# Patient Record
Sex: Female | Born: 1955 | Race: White | Hispanic: No | Marital: Married | Smoking: Former smoker
Health system: Southern US, Community
[De-identification: ages and names within clinical notes are randomized; demographics above are authoritative.]

## PROBLEM LIST (undated history)

## (undated) DIAGNOSIS — F1910 Other psychoactive substance abuse, uncomplicated: Secondary | ICD-10-CM

## (undated) DIAGNOSIS — M797 Fibromyalgia: Secondary | ICD-10-CM

## (undated) DIAGNOSIS — D649 Anemia, unspecified: Secondary | ICD-10-CM

## (undated) DIAGNOSIS — S329XXA Fracture of unspecified parts of lumbosacral spine and pelvis, initial encounter for closed fracture: Secondary | ICD-10-CM

## (undated) DIAGNOSIS — I341 Nonrheumatic mitral (valve) prolapse: Secondary | ICD-10-CM

## (undated) DIAGNOSIS — C801 Malignant (primary) neoplasm, unspecified: Secondary | ICD-10-CM

## (undated) DIAGNOSIS — I10 Essential (primary) hypertension: Secondary | ICD-10-CM

## (undated) DIAGNOSIS — F32A Depression, unspecified: Secondary | ICD-10-CM

## (undated) HISTORY — PX: BREAST SURGERY: SHX581

## (undated) HISTORY — DX: Malignant (primary) neoplasm, unspecified: C80.1

## (undated) HISTORY — DX: Other psychoactive substance abuse, uncomplicated: F19.10

## (undated) HISTORY — DX: Fracture of unspecified parts of lumbosacral spine and pelvis, initial encounter for closed fracture: S32.9XXA

## (undated) HISTORY — DX: Depression, unspecified: F32.A

## (undated) HISTORY — PX: FRACTURE SURGERY: SHX138

## (undated) HISTORY — PX: PELVIC FRACTURE SURGERY: SHX119

---

## 1990-02-09 DIAGNOSIS — J939 Pneumothorax, unspecified: Secondary | ICD-10-CM

## 1990-02-09 DIAGNOSIS — S329XXA Fracture of unspecified parts of lumbosacral spine and pelvis, initial encounter for closed fracture: Secondary | ICD-10-CM

## 1990-02-09 HISTORY — PX: CHEST TUBE INSERTION: SHX231

## 1990-02-09 HISTORY — DX: Pneumothorax, unspecified: J93.9

## 1990-02-09 HISTORY — DX: Fracture of unspecified parts of lumbosacral spine and pelvis, initial encounter for closed fracture: S32.9XXA

## 2000-08-17 ENCOUNTER — Other Ambulatory Visit: Admission: RE | Admit: 2000-08-17 | Discharge: 2000-08-17 | Payer: Self-pay | Admitting: Family Medicine

## 2004-06-26 ENCOUNTER — Other Ambulatory Visit: Admission: RE | Admit: 2004-06-26 | Discharge: 2004-06-26 | Payer: Self-pay | Admitting: Internal Medicine

## 2004-06-26 ENCOUNTER — Ambulatory Visit: Payer: Self-pay | Admitting: Internal Medicine

## 2004-11-12 ENCOUNTER — Ambulatory Visit: Payer: Self-pay | Admitting: Internal Medicine

## 2004-11-27 ENCOUNTER — Ambulatory Visit: Payer: Self-pay | Admitting: Internal Medicine

## 2005-06-01 ENCOUNTER — Ambulatory Visit: Payer: Self-pay | Admitting: Internal Medicine

## 2006-06-04 ENCOUNTER — Encounter: Payer: Self-pay | Admitting: Internal Medicine

## 2006-06-04 DIAGNOSIS — J309 Allergic rhinitis, unspecified: Secondary | ICD-10-CM | POA: Insufficient documentation

## 2006-06-04 DIAGNOSIS — I059 Rheumatic mitral valve disease, unspecified: Secondary | ICD-10-CM | POA: Insufficient documentation

## 2006-06-05 DIAGNOSIS — R609 Edema, unspecified: Secondary | ICD-10-CM | POA: Insufficient documentation

## 2006-06-11 ENCOUNTER — Ambulatory Visit: Payer: Self-pay | Admitting: Internal Medicine

## 2006-06-11 ENCOUNTER — Encounter: Payer: Self-pay | Admitting: Internal Medicine

## 2006-06-11 ENCOUNTER — Other Ambulatory Visit: Admission: RE | Admit: 2006-06-11 | Discharge: 2006-06-11 | Payer: Self-pay | Admitting: Internal Medicine

## 2006-06-23 ENCOUNTER — Encounter: Payer: Self-pay | Admitting: Internal Medicine

## 2006-06-23 ENCOUNTER — Ambulatory Visit: Payer: Self-pay | Admitting: Internal Medicine

## 2006-06-28 ENCOUNTER — Encounter (INDEPENDENT_AMBULATORY_CARE_PROVIDER_SITE_OTHER): Payer: Self-pay | Admitting: *Deleted

## 2006-07-07 ENCOUNTER — Ambulatory Visit: Payer: Self-pay | Admitting: Gastroenterology

## 2008-01-12 ENCOUNTER — Telehealth: Payer: Self-pay | Admitting: Internal Medicine

## 2008-02-14 ENCOUNTER — Ambulatory Visit: Payer: Self-pay | Admitting: Family Medicine

## 2008-11-27 ENCOUNTER — Ambulatory Visit: Payer: Self-pay | Admitting: Family Medicine

## 2008-11-27 DIAGNOSIS — R5383 Other fatigue: Secondary | ICD-10-CM

## 2008-11-27 DIAGNOSIS — M2550 Pain in unspecified joint: Secondary | ICD-10-CM | POA: Insufficient documentation

## 2008-11-27 DIAGNOSIS — R5381 Other malaise: Secondary | ICD-10-CM

## 2008-11-27 LAB — HM COLONOSCOPY

## 2008-11-27 LAB — HM SIGMOIDOSCOPY

## 2008-12-03 ENCOUNTER — Ambulatory Visit: Payer: Self-pay | Admitting: Family Medicine

## 2008-12-03 LAB — CONVERTED CEMR LAB: Anti Nuclear Antibody(ANA): NEGATIVE

## 2008-12-05 ENCOUNTER — Ambulatory Visit: Payer: Self-pay | Admitting: Family Medicine

## 2008-12-05 ENCOUNTER — Encounter (INDEPENDENT_AMBULATORY_CARE_PROVIDER_SITE_OTHER): Payer: Self-pay | Admitting: *Deleted

## 2008-12-05 LAB — CONVERTED CEMR LAB
Basophils Relative: 0.8 % (ref 0.0–3.0)
Bilirubin, Direct: 0.1 mg/dL (ref 0.0–0.3)
Calcium: 9.2 mg/dL (ref 8.4–10.5)
Cholesterol: 227 mg/dL — ABNORMAL HIGH (ref 0–200)
Creatinine, Ser: 0.8 mg/dL (ref 0.4–1.2)
Direct LDL: 133.7 mg/dL
Eosinophils Absolute: 0.2 10*3/uL (ref 0.0–0.7)
Fecal Occult Bld: NEGATIVE
GFR calc non Af Amer: 79.76 mL/min (ref >60)
Hemoglobin: 16 g/dL — ABNORMAL HIGH (ref 12.0–15.0)
Lymphocytes Relative: 23.7 % (ref 12.0–46.0)
MCHC: 34.7 g/dL (ref 30.0–36.0)
MCV: 88.9 fL (ref 78.0–100.0)
Neutro Abs: 3.2 10*3/uL (ref 1.4–7.7)
RBC: 5.18 M/uL — ABNORMAL HIGH (ref 3.87–5.11)
Sed Rate: 10 mm/hr (ref 0–22)
Total Bilirubin: 0.9 mg/dL (ref 0.3–1.2)
Total CHOL/HDL Ratio: 3
Total Protein: 7 g/dL (ref 6.0–8.3)
Triglycerides: 68 mg/dL (ref 0.0–149.0)
VLDL: 13.6 mg/dL (ref 0.0–40.0)

## 2008-12-05 LAB — FECAL OCCULT BLOOD, GUAIAC: Fecal Occult Blood: NEGATIVE

## 2008-12-11 ENCOUNTER — Encounter (INDEPENDENT_AMBULATORY_CARE_PROVIDER_SITE_OTHER): Payer: Self-pay | Admitting: *Deleted

## 2008-12-20 ENCOUNTER — Ambulatory Visit: Payer: Self-pay | Admitting: Family Medicine

## 2008-12-20 ENCOUNTER — Encounter: Payer: Self-pay | Admitting: Family Medicine

## 2008-12-21 ENCOUNTER — Telehealth (INDEPENDENT_AMBULATORY_CARE_PROVIDER_SITE_OTHER): Payer: Self-pay | Admitting: *Deleted

## 2008-12-25 ENCOUNTER — Encounter (INDEPENDENT_AMBULATORY_CARE_PROVIDER_SITE_OTHER): Payer: Self-pay | Admitting: *Deleted

## 2010-08-09 ENCOUNTER — Encounter: Payer: Self-pay | Admitting: Internal Medicine

## 2010-08-12 ENCOUNTER — Ambulatory Visit (INDEPENDENT_AMBULATORY_CARE_PROVIDER_SITE_OTHER): Payer: BC Managed Care – PPO | Admitting: Family Medicine

## 2010-08-12 ENCOUNTER — Encounter: Payer: Self-pay | Admitting: Family Medicine

## 2010-08-12 DIAGNOSIS — Z13220 Encounter for screening for lipoid disorders: Secondary | ICD-10-CM

## 2010-08-12 DIAGNOSIS — R5383 Other fatigue: Secondary | ICD-10-CM

## 2010-08-12 DIAGNOSIS — M2550 Pain in unspecified joint: Secondary | ICD-10-CM

## 2010-08-12 DIAGNOSIS — R5381 Other malaise: Secondary | ICD-10-CM

## 2010-08-12 DIAGNOSIS — M109 Gout, unspecified: Secondary | ICD-10-CM

## 2010-08-12 NOTE — Patient Instructions (Addendum)
Please schedule Complete Physcial Exam with fasting labs prior with myself...whomever you plan to have as primary MD.  Katie Dennis Desk: please change Primary Care MD to [facility]. Stop at front desk to set up rheumatology referral.

## 2010-08-12 NOTE — Assessment & Plan Note (Signed)
Basic rheum eval in 2000 neg except low voit D. Will refer to rheum for further eval... No straighforward fibromyalgia given only 1/2 trigger points are tender.

## 2010-08-12 NOTE — Progress Notes (Signed)
Addended by: Alvina Chou on: 08/12/2010 02:47 PM   Modules accepted: Orders

## 2010-08-12 NOTE — Assessment & Plan Note (Signed)
Evaluate with labs... Also due for labs for CPX.

## 2010-08-12 NOTE — Progress Notes (Signed)
Subjective:    Patient ID: Katie Dennis, female    DOB: 16-Sep-1955, 55 y.o.   MRN: [id number]  HPI  55 year old female not seen in 2 years presents for pain all over.  In past she has been seen for similar: in 2010.. RF, ANA , antiCCP antibodies were normal. Vit D was low.  This was repleted but helped minimally.    Chronic joint pain, feet , knees , hands and wrists, elbows, symmetric.Marland Kitchenocc has swelling in arms legs and hands. Occ difficulty getting up due to severe pain and stiffness. Most severe pain is in knees and ankles. Sometimes has redness and heat in joints.  Pain in joints but also in muscles as well. Has been ongoing x years about 2000.Marland KitchenMarland Kitchenprogressively worsening.   No weak muscles but feels weak due to pain. Burning and swelling in feet at end of the day. Occ tingling in left leg. She thinks in addition to these she may have gout... Redness and heat in great toe and left elbow occ. Has fatigue.  Occ has facial rash, not typically .  Has hand issues with Raynaud's  She does also have occ ulcers in mouth. Has seen doctors in past... no answers. Never had rhuem work up  [employer] to light.  Aunt has RA.  Typically using tylenol or ibuprofen for pain... Helps some but not really.  She does report some depression and anxiety. In past has been on prozac.   She is post menopausal... Last menses was several years ago.     Review of Systems  Constitutional: Positive for fatigue. Negative for fever.  HENT: Negative for congestion, rhinorrhea and neck stiffness.   Eyes: Negative for pain.  Respiratory: Negative for shortness of breath and wheezing.   Cardiovascular: Positive for leg swelling. Negative for chest pain and palpitations.  Gastrointestinal: Negative for abdominal pain, diarrhea, constipation and blood in stool.       Objective:   Physical Exam  Constitutional: She is oriented to person, place, and time. Vital signs are normal. She appears well-developed and  well-nourished. She is cooperative.  Non-toxic appearance. She does not appear ill. No distress.  HENT:  Head: Normocephalic.  Right Ear: Hearing, tympanic membrane, external ear and ear canal normal. Tympanic membrane is not erythematous, not retracted and not bulging.  Left Ear: Hearing, tympanic membrane, external ear and ear canal normal. Tympanic membrane is not erythematous, not retracted and not bulging.  Nose: No mucosal edema or rhinorrhea. Right sinus exhibits no maxillary sinus tenderness and no frontal sinus tenderness. Left sinus exhibits no maxillary sinus tenderness and no frontal sinus tenderness.  Mouth/Throat: Uvula is midline, oropharynx is clear and moist and mucous membranes are normal.  Eyes: Conjunctivae, EOM and lids are normal. Pupils are equal, round, and reactive to light. No foreign bodies found.  Neck: Trachea normal and normal range of motion. Neck supple. Carotid bruit is not present. No mass and no thyromegaly present.  Cardiovascular: Normal rate, regular rhythm, S1 normal, S2 normal, normal heart sounds, intact distal pulses and normal pulses.  Exam reveals no gallop and no friction rub.   No murmur heard. Pulmonary/Chest: Effort normal and breath sounds normal. Not tachypneic. No respiratory distress. She has no decreased breath sounds. She has no wheezes. She has no rhonchi. She has no rales.  Abdominal: Soft. Normal appearance and bowel sounds are normal. There is no tenderness.  Musculoskeletal: Normal range of motion. She exhibits no edema and no tenderness.  No redness or swelling in joints, full ROM throughout. No focal pain in knees or ankles. Neg Mcmurray's, neg ant/post drawer in B knees. About 6 positive trigger points for fibromyalgia, neg control.   Neurological: She is alert and oriented to person, place, and time. She has normal reflexes.  Skin: Skin is warm, dry and intact. No rash noted.  Psychiatric: Her speech is normal and behavior is  normal. Judgment and thought content normal. Her mood appears not anxious. Cognition and memory are normal. She does not exhibit a depressed mood.          Assessment & Plan:

## 2010-10-10 ENCOUNTER — Other Ambulatory Visit (INDEPENDENT_AMBULATORY_CARE_PROVIDER_SITE_OTHER): Payer: BC Managed Care – PPO

## 2010-10-10 DIAGNOSIS — R5383 Other fatigue: Secondary | ICD-10-CM

## 2010-10-10 DIAGNOSIS — M2550 Pain in unspecified joint: Secondary | ICD-10-CM

## 2010-10-10 DIAGNOSIS — M109 Gout, unspecified: Secondary | ICD-10-CM

## 2010-10-10 DIAGNOSIS — Z13220 Encounter for screening for lipoid disorders: Secondary | ICD-10-CM

## 2010-10-10 LAB — CBC WITH DIFFERENTIAL/PLATELET
Basophils Relative: 0.9 % (ref 0.0–3.0)
Eosinophils Relative: 4.8 % (ref 0.0–5.0)
HCT: 45.6 % (ref 36.0–46.0)
Lymphs Abs: 1 10*3/uL (ref 0.7–4.0)
Monocytes Relative: 9.2 % (ref 3.0–12.0)
Neutrophils Relative %: 65.2 % (ref 43.0–77.0)
Platelets: 206 10*3/uL (ref 150.0–400.0)
RBC: 5.18 Mil/uL — ABNORMAL HIGH (ref 3.87–5.11)
WBC: 5 10*3/uL (ref 4.5–10.5)

## 2010-10-10 LAB — COMPREHENSIVE METABOLIC PANEL
ALT: 26 U/L (ref 0–35)
Alkaline Phosphatase: 94 U/L (ref 39–117)
Glucose, Bld: 96 mg/dL (ref 70–99)
Sodium: 142 mEq/L (ref 135–145)
Total Bilirubin: 0.8 mg/dL (ref 0.3–1.2)
Total Protein: 7.1 g/dL (ref 6.0–8.3)

## 2010-10-10 LAB — URIC ACID: Uric Acid, Serum: 5 mg/dL (ref 2.4–7.0)

## 2010-10-10 LAB — LIPID PANEL
Cholesterol: 243 mg/dL — ABNORMAL HIGH (ref 0–200)
HDL: 72.4 mg/dL (ref >39.00)
Total CHOL/HDL Ratio: 3
VLDL: 16.6 mg/dL (ref 0.0–40.0)

## 2010-10-10 LAB — LDL CHOLESTEROL, DIRECT: Direct LDL: 169.1 mg/dL

## 2010-10-14 ENCOUNTER — Encounter: Payer: Self-pay | Admitting: Family Medicine

## 2010-10-14 ENCOUNTER — Ambulatory Visit (INDEPENDENT_AMBULATORY_CARE_PROVIDER_SITE_OTHER): Payer: BC Managed Care – PPO | Admitting: Family Medicine

## 2010-10-14 ENCOUNTER — Other Ambulatory Visit (HOSPITAL_COMMUNITY)
Admission: RE | Admit: 2010-10-14 | Discharge: 2010-10-14 | Disposition: A | Discharge status: Home / Self Care | Payer: BC Managed Care – PPO | Source: Ambulatory Visit | Attending: Family Medicine | Admitting: Family Medicine

## 2010-10-14 VITALS — BP 110/72 | HR 88 | Temp 98.4°F | Ht 61.0 in | Wt 132.0 lb

## 2010-10-14 DIAGNOSIS — M797 Fibromyalgia: Secondary | ICD-10-CM

## 2010-10-14 DIAGNOSIS — Z01419 Encounter for gynecological examination (general) (routine) without abnormal findings: Secondary | ICD-10-CM

## 2010-10-14 DIAGNOSIS — Z1159 Encounter for screening for other viral diseases: Secondary | ICD-10-CM | POA: Insufficient documentation

## 2010-10-14 DIAGNOSIS — N952 Postmenopausal atrophic vaginitis: Secondary | ICD-10-CM

## 2010-10-14 DIAGNOSIS — Z0000 Encounter for general adult medical examination without abnormal findings: Secondary | ICD-10-CM

## 2010-10-14 DIAGNOSIS — Z1231 Encounter for screening mammogram for malignant neoplasm of breast: Secondary | ICD-10-CM

## 2010-10-14 DIAGNOSIS — IMO0001 Reserved for inherently not codable concepts without codable children: Secondary | ICD-10-CM

## 2010-10-14 MED ORDER — ESTROGENS, CONJUGATED 0.625 MG/GM VA CREA: TOPICAL_CREAM | VAGINAL | Status: DC

## 2010-10-14 NOTE — Assessment & Plan Note (Signed)
Diagnosed  By Dr. Kellie Simmering.  Flexeril is helping significantly. Sleeping better so fatigue better.

## 2010-10-14 NOTE — Patient Instructions (Addendum)
Stop by [facility] to get mammo scheduled. Return stool cards.. Stop by lab to pick up.   Start premarin for vaginal issues, let me know if not improving.

## 2010-10-14 NOTE — Progress Notes (Signed)
  Subjective:    Patient ID: Katie Dennis, female    DOB: April 28, 1955, 55 y.o.   MRN: [id number]  HPI  The patient is here for annual wellness exam and preventative care.    Doing well overall.     Review of Systems  Constitutional: Negative for fever and fatigue.  HENT: Negative for ear pain.   Eyes: Negative for pain.  Respiratory: Negative for chest tightness and shortness of breath.   Cardiovascular: Negative for chest pain, palpitations and leg swelling.  Gastrointestinal: Negative for abdominal pain.  Genitourinary: Negative for dysuria.       Pain and dryness with sex.. Menopausal.       Objective:   Physical Exam  Constitutional: Vital signs are normal. She appears well-developed and well-nourished. She is cooperative.  Non-toxic appearance. She does not appear ill. No distress.  HENT:  Head: Normocephalic.  Right Ear: Hearing, tympanic membrane, external ear and ear canal normal.  Left Ear: Hearing, tympanic membrane, external ear and ear canal normal.  Nose: Nose normal.  Eyes: Conjunctivae, EOM and lids are normal. Pupils are equal, round, and reactive to light. No foreign bodies found.  Neck: Trachea normal and normal range of motion. Neck supple. Carotid bruit is not present. No mass and no thyromegaly present.  Cardiovascular: Normal rate, regular rhythm, S1 normal, S2 normal, normal heart sounds and intact distal pulses.  Exam reveals no gallop.   No murmur heard. Pulmonary/Chest: Effort normal and breath sounds normal. No respiratory distress. She has no wheezes. She has no rhonchi. She has no rales.  Abdominal: Soft. Normal appearance and bowel sounds are normal. She exhibits no distension, no fluid wave, no abdominal bruit and no mass. There is no hepatosplenomegaly. There is no tenderness. There is no rebound, no guarding and no CVA tenderness. No hernia.  Genitourinary: Uterus normal. No breast swelling, tenderness, discharge or bleeding. Pelvic exam was  performed with patient prone. There is no rash, tenderness or lesion on the right labia. There is no rash, tenderness or lesion on the left labia. Uterus is not enlarged and not tender. Cervix exhibits no motion tenderness, no discharge and no friability. Right adnexum displays no mass, no tenderness and no fullness. Left adnexum displays no mass, no tenderness and no fullness.       Vaginal dryness and postmenopausal changes  Lymphadenopathy:    She has no cervical adenopathy.    She has no axillary adenopathy.  Neurological: She is alert. She has normal strength. No cranial nerve deficit or sensory deficit.  Skin: Skin is warm, dry and intact. No rash noted.  Psychiatric: Her speech is normal and behavior is normal. Judgment normal. Her mood appears not anxious. Cognition and memory are normal. She does not exhibit a depressed mood.          Assessment & Plan:  The patient's preventative maintenance and recommended screening tests for an annual wellness exam were reviewed in full today. Brought up to date unless services declined.  Counselled on the importance of diet, exercise, and its role in overall health and mortality. The patient's FH and SH was reviewed, including their home life, tobacco status, and drug and alcohol status.   Due for PAP, DVE  Colon cancer screening:  Doing yearly stool cards.  Due for mammogram UpToDate with vaccines

## 2010-10-22 ENCOUNTER — Encounter: Payer: Self-pay | Admitting: *Deleted

## 2010-10-29 DIAGNOSIS — N952 Postmenopausal atrophic vaginitis: Secondary | ICD-10-CM | POA: Insufficient documentation

## 2010-10-29 NOTE — Assessment & Plan Note (Signed)
Start premarin cream, wean to  lowest dose effective. Discussed extensively risk and benefits of this medication. Pt voiced understanding of longterm CAD risk associated with estrogen use.

## 2010-11-06 ENCOUNTER — Other Ambulatory Visit: Payer: BC Managed Care – PPO

## 2010-11-06 ENCOUNTER — Other Ambulatory Visit: Payer: Self-pay | Admitting: Family Medicine

## 2010-11-06 ENCOUNTER — Encounter: Payer: Self-pay | Admitting: *Deleted

## 2010-11-06 DIAGNOSIS — Z1212 Encounter for screening for malignant neoplasm of rectum: Secondary | ICD-10-CM

## 2010-11-06 LAB — FECAL OCCULT BLOOD, IMMUNOCHEMICAL: Fecal Occult Bld: NEGATIVE

## 2010-12-05 ENCOUNTER — Ambulatory Visit (INDEPENDENT_AMBULATORY_CARE_PROVIDER_SITE_OTHER): Payer: BC Managed Care – PPO | Admitting: Family Medicine

## 2010-12-05 ENCOUNTER — Encounter: Payer: Self-pay | Admitting: Family Medicine

## 2010-12-05 DIAGNOSIS — M797 Fibromyalgia: Secondary | ICD-10-CM

## 2010-12-05 DIAGNOSIS — F329 Major depressive disorder, single episode, unspecified: Secondary | ICD-10-CM

## 2010-12-05 DIAGNOSIS — IMO0001 Reserved for inherently not codable concepts without codable children: Secondary | ICD-10-CM

## 2010-12-05 DIAGNOSIS — F339 Major depressive disorder, recurrent, unspecified: Secondary | ICD-10-CM | POA: Insufficient documentation

## 2010-12-05 MED ORDER — TRAMADOL HCL 50 MG PO TABS: 50.0000 mg | ORAL_TABLET | Freq: Two times a day (BID) | ORAL | Status: AC | PRN

## 2010-12-05 MED ORDER — DULOXETINE HCL 30 MG PO CPEP: ORAL_CAPSULE | ORAL | Status: DC

## 2010-12-05 NOTE — Patient Instructions (Signed)
Start cymbalta and increase as tolerated. Use tramadol  Once or twice daily for pain as needed. Continue healthy lifestyle, exercise. Can use flexeril for sleep and muscle spasm as needed but limit. Follow up in 1 month for mood, fibromyalgia. [email or web address]

## 2010-12-05 NOTE — Assessment & Plan Note (Addendum)
Poor control. Discussed treatment of mood. She will use tramadol for pain in limited fashion.  Start cymbalta for pain and mood.  Total visit time 30 minutes, > 50% spent counseling and cordinating patients care.

## 2010-12-05 NOTE — Assessment & Plan Note (Signed)
Poor control, start cymbalta. Work on stress reduction, relaxation. Not interested in counseling. Follow up in 1 months.

## 2010-12-05 NOTE — Progress Notes (Signed)
  Subjective:    Patient ID: Katie Dennis, female    DOB: 04/09/1955, 55 y.o.   MRN: [id number]  HPI  55 year old female presents for follow up on fibromyalgia.  Diagnosed By Dr. Kellie Simmering.  Flexeril had been helping significantly at last appt in 10/2010... She is sleeping well but continuing to have daily pain in lower half of body.  Pain in back, hips, legs feet, knees, pelvis. 6-0/10 on pain scale.  Very stiff after sitting a while.  Pins and needle in feet in AMs. Iburpofen andaleve do not help, some upset stomach.  Mood is poorly controlled... Ongoing for a while.. Past few years. Depression, mild anxiety.  Occ SI in past but not recently. In past she was on prozac, helped some no SE.       Review of Systems  Constitutional: Negative for fever and fatigue.  HENT: Negative for ear pain.   Eyes: Negative for pain.  Respiratory: Negative for chest tightness and shortness of breath.   Cardiovascular: Negative for chest pain, palpitations and leg swelling.  Gastrointestinal: Negative for abdominal pain.  Genitourinary: Negative for dysuria.  Musculoskeletal: Positive for myalgias, back pain and arthralgias. Negative for gait problem.  Skin: Negative for rash.       Objective:   Physical Exam  Constitutional: She appears well-developed and well-nourished.  Musculoskeletal:       Lumbar back: She exhibits normal range of motion and no tenderness.       Multiple tender trigger points in lower extremeties.  Psychiatric: Her speech is normal. Judgment normal. Her mood appears not anxious. She is withdrawn. Cognition and memory are normal. She exhibits a depressed mood. She expresses no suicidal ideation. She expresses no suicidal plans.          Assessment & Plan:

## 2010-12-30 ENCOUNTER — Encounter: Payer: Self-pay | Admitting: Family Medicine

## 2010-12-30 ENCOUNTER — Ambulatory Visit: Payer: Self-pay | Admitting: Family Medicine

## 2010-12-31 ENCOUNTER — Encounter: Payer: Self-pay | Admitting: *Deleted

## 2010-12-31 NOTE — Progress Notes (Signed)
Quick Note:  Epic updated and letter mailed to patient with results ______

## 2011-01-09 ENCOUNTER — Ambulatory Visit: Payer: BC Managed Care – PPO | Admitting: Family Medicine

## 2011-01-30 ENCOUNTER — Ambulatory Visit (INDEPENDENT_AMBULATORY_CARE_PROVIDER_SITE_OTHER): Payer: BC Managed Care – PPO | Admitting: Family Medicine

## 2011-01-30 ENCOUNTER — Encounter: Payer: Self-pay | Admitting: Family Medicine

## 2011-01-30 DIAGNOSIS — IMO0001 Reserved for inherently not codable concepts without codable children: Secondary | ICD-10-CM

## 2011-01-30 DIAGNOSIS — M797 Fibromyalgia: Secondary | ICD-10-CM

## 2011-01-30 DIAGNOSIS — F329 Major depressive disorder, single episode, unspecified: Secondary | ICD-10-CM

## 2011-01-30 MED ORDER — DULOXETINE HCL 60 MG PO CPEP: ORAL_CAPSULE | ORAL | Status: DC

## 2011-01-30 MED ORDER — CYCLOBENZAPRINE HCL 10 MG PO TABS: ORAL_TABLET | ORAL | Status: DC

## 2011-01-30 MED ORDER — TRAMADOL HCL 50 MG PO TABS: ORAL_TABLET | ORAL | Status: DC

## 2011-01-30 NOTE — Patient Instructions (Signed)
Continue cymbalta. Follow up as needed or for CPX in 10/2011. Do not stop med without MD guidance.  Continue working on regular exercise and healthy eating.

## 2011-01-30 NOTE — Progress Notes (Signed)
  Subjective:    Patient ID: Katie Dennis, female    DOB: [birth year], 55 y.o.   MRN: [id number]  HPI 55 year old female presents for follow up on fibromyalgia.  Diagnosed By Dr. Kellie Simmering.  Flexeril had been helping significantly at last appt in 10/2010...   At last appt : Pain in back, hips, legs feet, knees, pelvis. 6-0/10 on pain scale. She is sleeping well but continuing to have daily pain in lower half of body.  Very stiff after sitting a while.  Pins and needle in feet in AMs.    Depression, mild anxiety.  At last OV poorly controlled.   She has now been on cymbalta 60 mg  since 10/26... She reports that her pain has diminished greatly. He mood is much better.  No SI, no insomnia. Using tramadol for flares...every few days, -2 tabs at a time. Using flexeril using every 1-2 tabs nightly.    Review of Systems  Constitutional: Negative for fever and fatigue.  HENT: Negative for ear pain.   Eyes: Negative for pain.  Respiratory: Negative for chest tightness and shortness of breath.   Cardiovascular: Negative for chest pain, palpitations and leg swelling.  Gastrointestinal: Negative for abdominal pain.  Genitourinary: Negative for dysuria.       Objective:   Physical Exam  Constitutional: She appears well-developed and well-nourished.  HENT:  Head: Normocephalic and atraumatic.  Eyes: Conjunctivae and EOM are normal. Pupils are equal, round, and reactive to light.  Neck: Normal range of motion. Neck supple.  Cardiovascular: Normal rate, regular rhythm, normal heart sounds and intact distal pulses.  Exam reveals no gallop and no friction rub.   No murmur heard. Musculoskeletal:       No trigger points noted  Skin: Skin is warm and dry.  Psychiatric: She has a normal mood and affect. Her behavior is normal. Judgment and thought content normal.          Assessment & Plan:

## 2011-01-30 NOTE — Assessment & Plan Note (Signed)
Improved on cymbalta 60 mg daily. Use tramadol and flexeril on limited basis.

## 2011-01-30 NOTE — Assessment & Plan Note (Signed)
Significant improvement on cymbalta.

## 2011-04-12 ENCOUNTER — Other Ambulatory Visit: Payer: Self-pay | Admitting: Family Medicine

## 2011-04-24 ENCOUNTER — Other Ambulatory Visit: Payer: Self-pay | Admitting: Family Medicine

## 2011-04-24 NOTE — Telephone Encounter (Signed)
LAst OV 12/12

## 2011-06-10 ENCOUNTER — Other Ambulatory Visit: Payer: Self-pay | Admitting: Family Medicine

## 2011-06-15 ENCOUNTER — Other Ambulatory Visit: Payer: Self-pay | Admitting: Family Medicine

## 2011-08-07 ENCOUNTER — Other Ambulatory Visit: Payer: Self-pay | Admitting: Family Medicine

## 2011-08-09 ENCOUNTER — Other Ambulatory Visit: Payer: Self-pay | Admitting: Family Medicine

## 2011-08-12 ENCOUNTER — Other Ambulatory Visit: Payer: Self-pay | Admitting: Family Medicine

## 2011-10-07 ENCOUNTER — Other Ambulatory Visit: Payer: Self-pay | Admitting: Family Medicine

## 2011-10-09 NOTE — Telephone Encounter (Signed)
Will refill once, but needs to make appt for cpx or yearly follow up.

## 2011-10-09 NOTE — Telephone Encounter (Signed)
Ok to refill 

## 2011-10-12 ENCOUNTER — Other Ambulatory Visit: Payer: Self-pay | Admitting: Family Medicine

## 2011-12-10 ENCOUNTER — Other Ambulatory Visit: Payer: Self-pay | Admitting: Family Medicine

## 2011-12-10 NOTE — Telephone Encounter (Signed)
Ok to refill 

## 2011-12-11 NOTE — Telephone Encounter (Signed)
Due for follow up in December.. Have pt make appt and refill until then.

## 2011-12-12 ENCOUNTER — Other Ambulatory Visit: Payer: Self-pay | Admitting: Family Medicine

## 2011-12-13 ENCOUNTER — Other Ambulatory Visit: Payer: Self-pay | Admitting: Family Medicine

## 2011-12-14 NOTE — Telephone Encounter (Signed)
Refill once, but pt needs appt for follow up.

## 2011-12-14 NOTE — Telephone Encounter (Signed)
Refill called in to pharmacy

## 2011-12-15 NOTE — Telephone Encounter (Signed)
Received refill request electronically. Last office visit 01/30/11. Is it okay to refill medication?

## 2011-12-17 ENCOUNTER — Encounter: Payer: Self-pay | Admitting: Family Medicine

## 2011-12-17 ENCOUNTER — Ambulatory Visit (INDEPENDENT_AMBULATORY_CARE_PROVIDER_SITE_OTHER): Payer: BC Managed Care – PPO | Admitting: Family Medicine

## 2011-12-17 VITALS — BP 150/92 | HR 103 | Temp 98.4°F | Ht 61.5 in | Wt 143.8 lb

## 2011-12-17 DIAGNOSIS — I10 Essential (primary) hypertension: Secondary | ICD-10-CM | POA: Insufficient documentation

## 2011-12-17 LAB — POCT URINALYSIS DIPSTICK
Blood, UA: NEGATIVE
Ketones, UA: NEGATIVE
Spec Grav, UA: 1.025

## 2011-12-17 MED ORDER — LOSARTAN POTASSIUM-HCTZ 50-12.5 MG PO TABS: 1.0000 | ORAL_TABLET | Freq: Every day | ORAL | Status: DC

## 2011-12-17 NOTE — Progress Notes (Signed)
  Subjective:    Patient ID: Katie Dennis, female    DOB: 18-May-1955, 56 y.o.   MRN: [id number]  HPI  56 year old female presents with recently elevated BPs at home.. 164/100, 190/100 last night.  She has noted increase since 09/2011 following knee injury.She has noted a lot of peripheral swelling, in ankles but all over. Mild chest tightness intermittently, worse at night, at rest, no change with exertion. Has noted increase in DOE.     No exercise, healthy diet. Has had increase in stress. Nonsmoker.  Personal history of pregnancy induced HTN, normalized afterward.  Mother with HTN.    Review of Systems  Constitutional: Positive for fatigue. Negative for fever.  HENT: Negative for rhinorrhea.   Eyes: Negative for pain and visual disturbance.  Respiratory: Negative for cough.   Gastrointestinal: Negative for abdominal pain.  Skin: Negative for rash.       Objective:   Physical Exam  Constitutional: Vital signs are normal. She appears well-developed and well-nourished. She is cooperative.  Non-toxic appearance. She does not appear ill. No distress.  HENT:  Head: Normocephalic.  Right Ear: Hearing, tympanic membrane, external ear and ear canal normal. Tympanic membrane is not erythematous, not retracted and not bulging.  Left Ear: Hearing, tympanic membrane, external ear and ear canal normal. Tympanic membrane is not erythematous, not retracted and not bulging.  Nose: No mucosal edema or rhinorrhea. Right sinus exhibits no maxillary sinus tenderness and no frontal sinus tenderness. Left sinus exhibits no maxillary sinus tenderness and no frontal sinus tenderness.  Mouth/Throat: Uvula is midline, oropharynx is clear and moist and mucous membranes are normal.  Eyes: Conjunctivae normal, EOM and lids are normal. Pupils are equal, round, and reactive to light. No foreign bodies found.  Neck: Trachea normal and normal range of motion. Neck supple. Carotid bruit is not present. No mass  and no thyromegaly present.  Cardiovascular: Normal rate, regular rhythm, S1 normal, S2 normal, normal heart sounds, intact distal pulses and normal pulses.  Exam reveals no gallop and no friction rub.   No murmur heard. Pulmonary/Chest: Effort normal and breath sounds normal. Not tachypneic. No respiratory distress. She has no decreased breath sounds. She has no wheezes. She has no rhonchi. She has no rales.  Abdominal: Soft. Normal appearance and bowel sounds are normal. There is no tenderness.  Neurological: She is alert.  Skin: Skin is warm, dry and intact. No rash noted.  Psychiatric: Her speech is normal and behavior is normal. Judgment and thought content normal. Her mood appears not anxious. Cognition and memory are normal. She does not exhibit a depressed mood.          Assessment & Plan:

## 2011-12-17 NOTE — Assessment & Plan Note (Addendum)
Eval for end organ damage, secondary causes and risk factors. (EKG NSR, no LVH, UA trace protein)  Start on losartan HCTZ. Encouraged exercise, weight loss, healthy eating habits. Follow Bp at home and recheck in 2 weeks (Cr repeat at that time as well)

## 2011-12-17 NOTE — Patient Instructions (Addendum)
Work on regular exercise as tolerated, decrease stress, work on 10% current weight reduction. We will call you with lab results. Start losartan/HCTZ daily. Follow BP at home... Goal <140/90. Follow up in 2 weeks. Fasting labs in next few days Schedule CPX in next few months as well.. No labs prior. Katie Dennis

## 2011-12-21 ENCOUNTER — Other Ambulatory Visit (INDEPENDENT_AMBULATORY_CARE_PROVIDER_SITE_OTHER): Payer: BC Managed Care – PPO

## 2011-12-21 DIAGNOSIS — I10 Essential (primary) hypertension: Secondary | ICD-10-CM

## 2011-12-21 LAB — CBC WITH DIFFERENTIAL/PLATELET
Eosinophils Relative: 4.8 % (ref 0.0–5.0)
HCT: 47.6 % — ABNORMAL HIGH (ref 36.0–46.0)
Hemoglobin: 15.5 g/dL — ABNORMAL HIGH (ref 12.0–15.0)
Lymphs Abs: 1.4 10*3/uL (ref 0.7–4.0)
MCV: 84.6 fl (ref 78.0–100.0)
Monocytes Absolute: 0.7 10*3/uL (ref 0.1–1.0)
Monocytes Relative: 9.2 % (ref 3.0–12.0)
Neutro Abs: 4.9 10*3/uL (ref 1.4–7.7)
RDW: 12.8 % (ref 11.5–14.6)

## 2011-12-21 LAB — COMPREHENSIVE METABOLIC PANEL
ALT: 28 U/L (ref 0–35)
AST: 23 U/L (ref 0–37)
Albumin: 3.9 g/dL (ref 3.5–5.2)
Alkaline Phosphatase: 114 U/L (ref 39–117)
BUN: 16 mg/dL (ref 6–23)
Calcium: 9.3 mg/dL (ref 8.4–10.5)
Chloride: 101 mEq/L (ref 96–112)
Potassium: 4.6 mEq/L (ref 3.5–5.1)
Total Protein: 7.1 g/dL (ref 6.0–8.3)

## 2011-12-21 LAB — LIPID PANEL: Total CHOL/HDL Ratio: 4

## 2011-12-21 LAB — TSH: TSH: 1.62 u[IU]/mL (ref 0.35–5.50)

## 2011-12-21 LAB — LDL CHOLESTEROL, DIRECT: Direct LDL: 132.3 mg/dL

## 2011-12-25 ENCOUNTER — Encounter: Payer: Self-pay | Admitting: *Deleted

## 2012-02-09 ENCOUNTER — Other Ambulatory Visit: Payer: Self-pay | Admitting: Family Medicine

## 2012-03-25 ENCOUNTER — Encounter: Payer: BC Managed Care – PPO | Admitting: Family Medicine

## 2012-04-19 ENCOUNTER — Other Ambulatory Visit: Payer: Self-pay | Admitting: Family Medicine

## 2012-04-20 ENCOUNTER — Other Ambulatory Visit: Payer: Self-pay | Admitting: Family Medicine

## 2012-05-13 ENCOUNTER — Encounter: Payer: Self-pay | Admitting: Family Medicine

## 2012-05-13 ENCOUNTER — Ambulatory Visit (INDEPENDENT_AMBULATORY_CARE_PROVIDER_SITE_OTHER): Payer: BC Managed Care – PPO | Admitting: Family Medicine

## 2012-05-13 VITALS — BP 110/72 | HR 86 | Temp 98.2°F | Ht 61.5 in | Wt 140.0 lb

## 2012-05-13 DIAGNOSIS — Z23 Encounter for immunization: Secondary | ICD-10-CM

## 2012-05-13 DIAGNOSIS — IMO0001 Reserved for inherently not codable concepts without codable children: Secondary | ICD-10-CM

## 2012-05-13 DIAGNOSIS — Z1212 Encounter for screening for malignant neoplasm of rectum: Secondary | ICD-10-CM

## 2012-05-13 DIAGNOSIS — M797 Fibromyalgia: Secondary | ICD-10-CM

## 2012-05-13 DIAGNOSIS — F329 Major depressive disorder, single episode, unspecified: Secondary | ICD-10-CM

## 2012-05-13 DIAGNOSIS — I10 Essential (primary) hypertension: Secondary | ICD-10-CM

## 2012-05-13 DIAGNOSIS — Z0000 Encounter for general adult medical examination without abnormal findings: Secondary | ICD-10-CM

## 2012-05-13 NOTE — Addendum Note (Signed)
Addended by: Consuello Masse on: 05/13/2012 11:26 AM   Modules accepted: Orders

## 2012-05-13 NOTE — Progress Notes (Signed)
HPI  The patient is here for annual wellness exam and preventative care.   Doing well overall.   Hypertension:   At goal on current med... Hyzaar. Using medication without problems or lightheadedness: None Chest pain with exertion:None Edema: at end of the day Short of breath:None Average home BPs:  occ diastolic elevated, better with exercise Other issues: Wt Readings from Last 3 Encounters:  05/13/12 140 lb (63.504 kg)  12/17/11 143 lb 12.8 oz (65.227 kg)  01/30/11 134 lb 12.8 oz (61.145 kg)    Cholesterol at almost at goal on no med. Lab Results  Component Value Date   CHOL 202* 12/21/2011   HDL 51.60 12/21/2011   LDLDIRECT 132.3 12/21/2011   TRIG 144.0 12/21/2011   CHOLHDL 4 12/21/2011   Prediabetes: FBS was elevated 4 months ago with labs.  Recently with exercise ... FBS at home has been at 69-117.Marland Kitchen Better with recent exercise.  Fibromyalgia:  Stable on muscle relaxant..  cyclobenzaprine20 mg at bedtime, cymbalta, using tramadol every day as needed for pain. Uses more with flares associated with weather changes.  Going to [facility]  3 times a week. Diet: poor, snacks a lot  Wt Readings from Last 3 Encounters:  05/13/12 140 lb (63.504 kg)  12/17/11 143 lb 12.8 oz (65.227 kg)  01/30/11 134 lb 12.8 oz (61.145 kg)   Uses mg with melatonin.  Review of Systems  Constitutional: Negative for fever and fatigue.  HENT: Negative for ear pain.  Eyes: Negative for pain.  Respiratory: Negative for chest tightness and shortness of breath.  Cardiovascular: Negative for chest pain, palpitations and leg swelling.  Gastrointestinal: Negative for abdominal pain.  Come constipation controlled with stool softner. Genitourinary: Negative for dysuria.  Pain and dryness with sex.. Menopausal.  Objective:   Physical Exam  Constitutional: Vital signs are normal. She appears well-developed and well-nourished. She is cooperative. Non-toxic appearance. She does not appear ill. No distress.   HENT:  Head: Normocephalic.  Right Ear: Hearing, tympanic membrane, external ear and ear canal normal.  Left Ear: Hearing, tympanic membrane, external ear and ear canal normal.  Nose: Nose normal.  Eyes: Conjunctivae, EOM and lids are normal. Pupils are equal, round, and reactive to light. No foreign bodies found.  Neck: Trachea normal and normal range of motion. Neck supple. Carotid bruit is not present. No mass and no thyromegaly present.  Cardiovascular: Normal rate, regular rhythm, S1 normal, S2 normal, normal heart sounds and intact distal pulses. Exam reveals no gallop.  No murmur heard.  Pulmonary/Chest: Effort normal and breath sounds normal. No respiratory distress. She has no wheezes. She has no rhonchi. She has no rales.  Abdominal: Soft. Normal appearance and bowel sounds are normal. She exhibits no distension, no fluid wave, no abdominal bruit and no mass. There is no hepatosplenomegaly. There is no tenderness. There is no rebound, no guarding and no CVA tenderness. No hernia.  Genitourinary: Uterus normal. No breast swelling, tenderness, discharge or bleeding.  There is no rash, tenderness or lesion on the right labia. There is no rash, tenderness or lesion on the left labia. Uterus is not enlarged and not tender. Right adnexum displays no mass, no tenderness and no fullness. Left adnexum displays no mass, no tenderness and no fullness.  Vaginal dryness and postmenopausal changes  Lymphadenopathy:  She has no cervical adenopathy.  She has no axillary adenopathy.  Neurological: She is alert. She has normal strength. No cranial nerve deficit or sensory deficit.  Skin: Skin is warm,  dry and intact. No rash noted.  Psychiatric: Her speech is normal and behavior is normal. Judgment normal. Her mood appears not anxious. Cognition and memory are normal. She does not exhibit a depressed mood.  Assessment & Plan:   The patient's preventative maintenance and recommended screening tests for  an annual wellness exam were reviewed in full today.  Brought up to date unless services declined.  Counselled on the importance of diet, exercise, and its role in overall health and mortality.  The patient's FH and SH was reviewed, including their home life, tobacco status, and drug and alcohol status.   Last pap 2012, on q3 year schedule, yearly DVE. Colon cancer screening: Doing yearly stool cards.  Due for mammogram  Vaccines: Due for Tdap.

## 2012-05-13 NOTE — Assessment & Plan Note (Signed)
Well-controlled on current regimen. ?

## 2012-05-13 NOTE — Assessment & Plan Note (Signed)
Well controlled. Continue current medication.  

## 2012-05-13 NOTE — Assessment & Plan Note (Signed)
Stable control on cymbalta. 

## 2012-05-13 NOTE — Patient Instructions (Addendum)
Keep a eye on blood sugar, goal <100. Continue working on exercise weight loss, and improve eating. Stop at lab on way out. Schedule mammogram on your own.

## 2012-05-31 ENCOUNTER — Other Ambulatory Visit (INDEPENDENT_AMBULATORY_CARE_PROVIDER_SITE_OTHER): Payer: BC Managed Care – PPO

## 2012-05-31 ENCOUNTER — Other Ambulatory Visit: Payer: Self-pay | Admitting: Family Medicine

## 2012-05-31 DIAGNOSIS — Z1212 Encounter for screening for malignant neoplasm of rectum: Secondary | ICD-10-CM

## 2012-05-31 LAB — FECAL OCCULT BLOOD, IMMUNOCHEMICAL: Fecal Occult Bld: NEGATIVE

## 2012-06-12 ENCOUNTER — Other Ambulatory Visit: Payer: Self-pay | Admitting: Family Medicine

## 2012-08-02 ENCOUNTER — Other Ambulatory Visit: Payer: Self-pay | Admitting: Family Medicine

## 2012-08-18 ENCOUNTER — Other Ambulatory Visit: Payer: Self-pay | Admitting: Family Medicine

## 2012-09-20 ENCOUNTER — Other Ambulatory Visit: Payer: Self-pay | Admitting: Family Medicine

## 2012-09-21 NOTE — Telephone Encounter (Signed)
Refill called to [facility]. 

## 2012-10-04 ENCOUNTER — Other Ambulatory Visit: Payer: Self-pay | Admitting: Family Medicine

## 2012-10-18 ENCOUNTER — Other Ambulatory Visit: Payer: Self-pay | Admitting: Family Medicine

## 2012-10-18 NOTE — Telephone Encounter (Signed)
Received refill request electronically. Last office visit 05/13/12. See drug warning with Tramadol. Is it okay to refill medication?

## 2012-11-15 ENCOUNTER — Encounter: Payer: Self-pay | Admitting: Family Medicine

## 2012-11-15 ENCOUNTER — Ambulatory Visit (INDEPENDENT_AMBULATORY_CARE_PROVIDER_SITE_OTHER): Payer: BC Managed Care – PPO | Admitting: Family Medicine

## 2012-11-15 VITALS — BP 128/96 | HR 75 | Temp 98.2°F | Ht 61.5 in | Wt 144.5 lb

## 2012-11-15 DIAGNOSIS — Z79899 Other long term (current) drug therapy: Secondary | ICD-10-CM

## 2012-11-15 DIAGNOSIS — I10 Essential (primary) hypertension: Secondary | ICD-10-CM

## 2012-11-15 DIAGNOSIS — R609 Edema, unspecified: Secondary | ICD-10-CM | POA: Insufficient documentation

## 2012-11-15 LAB — COMPREHENSIVE METABOLIC PANEL
ALT: 26 U/L (ref 0–35)
AST: 21 U/L (ref 0–37)
Albumin: 3.8 g/dL (ref 3.5–5.2)
Alkaline Phosphatase: 84 U/L (ref 39–117)
Creatinine, Ser: 0.9 mg/dL (ref 0.4–1.2)
Glucose, Bld: 110 mg/dL — ABNORMAL HIGH (ref 70–99)
Potassium: 4.4 mEq/L (ref 3.5–5.1)
Sodium: 140 mEq/L (ref 135–145)
Total Bilirubin: 0.6 mg/dL (ref 0.3–1.2)
Total Protein: 6.9 g/dL (ref 6.0–8.3)

## 2012-11-15 LAB — LDL CHOLESTEROL, DIRECT: Direct LDL: 153.4 mg/dL

## 2012-11-15 LAB — LIPID PANEL
Cholesterol: 224 mg/dL — ABNORMAL HIGH (ref 0–200)
Total CHOL/HDL Ratio: 4
Triglycerides: 129 mg/dL (ref 0.0–149.0)
VLDL: 25.8 mg/dL (ref 0.0–40.0)

## 2012-11-15 MED ORDER — LOSARTAN POTASSIUM-HCTZ 100-25 MG PO TABS: 1.0000 | ORAL_TABLET | Freq: Every day | ORAL | Status: DC

## 2012-11-15 MED ORDER — TRAMADOL HCL 50 MG PO TABS: ORAL_TABLET | ORAL | Status: DC

## 2012-11-15 NOTE — Progress Notes (Signed)
  Subjective:    Patient ID: Katie Dennis, female    DOB: 10-Oct-1955, 57 y.o.   MRN: [id number]  HPI 57 year old female presents with recent increase in BPS.  Hypertension:  Inadequate control on losartan HCTZ  50/12.5 mg  She has been noting fluid and swelling off and on since July. BPs elevated since then.  Always tired. Using medication without problems or lightheadedness: occ Chest pain with exertion:None  Edema:yes Short of breath: mild Average home BPs: 134-150/84-100 Other issues:     Review of Systems  Constitutional: Negative for fever and fatigue.  HENT: Negative for ear pain.   Eyes: Negative for pain.  Respiratory: Negative for chest tightness and shortness of breath.   Cardiovascular: Positive for leg swelling. Negative for chest pain and palpitations.  Gastrointestinal: Negative for abdominal pain.  Genitourinary: Negative for dysuria.       Objective:   Physical Exam  Constitutional: Vital signs are normal. She appears well-developed and well-nourished. She is cooperative.  Non-toxic appearance. She does not appear ill. No distress.  HENT:  Head: Normocephalic.  Right Ear: Hearing, tympanic membrane, external ear and ear canal normal. Tympanic membrane is not erythematous, not retracted and not bulging.  Left Ear: Hearing, tympanic membrane, external ear and ear canal normal. Tympanic membrane is not erythematous, not retracted and not bulging.  Nose: No mucosal edema or rhinorrhea. Right sinus exhibits no maxillary sinus tenderness and no frontal sinus tenderness. Left sinus exhibits no maxillary sinus tenderness and no frontal sinus tenderness.  Mouth/Throat: Uvula is midline, oropharynx is clear and moist and mucous membranes are normal.  Eyes: Conjunctivae, EOM and lids are normal. Pupils are equal, round, and reactive to light. Lids are everted and swept, no foreign bodies found.  Neck: Trachea normal and normal range of motion. Neck supple. Carotid bruit is not  present. No mass and no thyromegaly present.  Cardiovascular: Normal rate, regular rhythm, S1 normal, S2 normal, normal heart sounds, intact distal pulses and normal pulses.  Exam reveals no gallop and no friction rub.   No murmur heard. No peripheral edema today  Pulmonary/Chest: Effort normal and breath sounds normal. Not tachypneic. No respiratory distress. She has no decreased breath sounds. She has no wheezes. She has no rhonchi. She has no rales.  Abdominal: Soft. Normal appearance and bowel sounds are normal. There is no tenderness.  Neurological: She is alert.  Skin: Skin is warm, dry and intact. No rash noted.  Psychiatric: Her speech is normal and behavior is normal. Judgment and thought content normal. Her mood appears not anxious. Cognition and memory are normal. She does not exhibit a depressed mood.          Assessment & Plan:

## 2012-11-15 NOTE — Assessment & Plan Note (Addendum)
Inadequate control... Increase losartan HCTZ. Encouraged exercise, weight loss, healthy eating habits.  Due for chol re-eval.

## 2012-11-15 NOTE — Patient Instructions (Addendum)
Increase losartan HCTZ to 100/25 mg daily. Follow BP at home. Call if is consistently remaining above 140/90. Follow up BP in 2 weeks.

## 2012-11-15 NOTE — Assessment & Plan Note (Signed)
eval with labs for causes.

## 2012-11-28 ENCOUNTER — Encounter: Payer: Self-pay | Admitting: Radiology

## 2012-11-29 ENCOUNTER — Ambulatory Visit (INDEPENDENT_AMBULATORY_CARE_PROVIDER_SITE_OTHER): Payer: BC Managed Care – PPO | Admitting: Family Medicine

## 2012-11-29 ENCOUNTER — Encounter: Payer: Self-pay | Admitting: Family Medicine

## 2012-11-29 VITALS — BP 120/80 | HR 80 | Temp 98.5°F | Ht 61.5 in | Wt 145.0 lb

## 2012-11-29 DIAGNOSIS — E7800 Pure hypercholesterolemia, unspecified: Secondary | ICD-10-CM

## 2012-11-29 DIAGNOSIS — R7309 Other abnormal glucose: Secondary | ICD-10-CM

## 2012-11-29 DIAGNOSIS — I10 Essential (primary) hypertension: Secondary | ICD-10-CM

## 2012-11-29 DIAGNOSIS — R7303 Prediabetes: Secondary | ICD-10-CM | POA: Insufficient documentation

## 2012-11-29 LAB — BASIC METABOLIC PANEL
BUN: 16 mg/dL (ref 6–23)
Calcium: 9.4 mg/dL (ref 8.4–10.5)
GFR: 73.28 mL/min (ref >60.00)
Potassium: 3.5 mEq/L (ref 3.5–5.1)
Sodium: 139 mEq/L (ref 135–145)

## 2012-11-29 MED ORDER — AMLODIPINE BESYLATE 5 MG PO TABS: 5.0000 mg | ORAL_TABLET | Freq: Every day | ORAL | Status: DC

## 2012-11-29 NOTE — Assessment & Plan Note (Signed)
Encouraged exercise, weight loss, healthy eating habits. ? ?

## 2012-11-29 NOTE — Assessment & Plan Note (Signed)
Work on [employer]. Start red yeast rice.

## 2012-11-29 NOTE — Patient Instructions (Addendum)
Start amlodipine 5 mg daily. Continue losartan HCTZ. Work on low fat, low cholesterol, low carbohydrate diet. Start red yeast rice 600 mg twice tabs twice daily. Stop at the lab on way out. Follow BP at home.. Call if it remains above 140/90 consistently. Follow up BP in 1 month.

## 2012-11-29 NOTE — Assessment & Plan Note (Signed)
Improved control here, but reamins poor at home. She has evaluated her cuff.  Check Creatinine on higher dose losartan today.  Start amlodipine 5 mg daily. Follow at home and follow up in  1 month for BP chevck.

## 2012-11-29 NOTE — Progress Notes (Signed)
  Subjective:    Patient ID: Katie Dennis, female    DOB: Nov 02, 1955, 57 y.o.   MRN: [id number]  HPI 57 year old female returns for 2 week follow up on HTN. At last OV increased losartan HCTZ. BP Readings from Last 3 Encounters:  11/29/12 120/80  11/15/12 128/96  05/13/12 110/72     Using medication without problems or lightheadedness: None Chest pain with exertion: No Edema:Improved. Short of breath:No Average home BPs: 136-150/78-96 Other issues: She had been on atenolol in past for mitral valve prolapse, tolerated well.  Labs reviewed in detail with pt   Cholesterol not at goal <130.  Eating out a lot. Lab Results  Component Value Date   CHOL 224* 11/15/2012   HDL 56.60 11/15/2012   LDLDIRECT 153.4 11/15/2012   TRIG 129.0 11/15/2012   CHOLHDL 4 11/15/2012   New dx prediabtes.  Review of Systems  Constitutional: Negative for fever and fatigue.  HENT: Negative for ear pain.   Eyes: Negative for pain.  Respiratory: Negative for chest tightness and shortness of breath.   Cardiovascular: Negative for chest pain, palpitations and leg swelling.  Gastrointestinal: Negative for abdominal pain.  Genitourinary: Negative for dysuria.       Objective:   Physical Exam  Constitutional: Vital signs are normal. She appears well-developed and well-nourished. She is cooperative.  Non-toxic appearance. She does not appear ill. No distress.  HENT:  Head: Normocephalic.  Right Ear: Hearing, tympanic membrane, external ear and ear canal normal. Tympanic membrane is not erythematous, not retracted and not bulging.  Left Ear: Hearing, tympanic membrane, external ear and ear canal normal. Tympanic membrane is not erythematous, not retracted and not bulging.  Nose: No mucosal edema or rhinorrhea. Right sinus exhibits no maxillary sinus tenderness and no frontal sinus tenderness. Left sinus exhibits no maxillary sinus tenderness and no frontal sinus tenderness.  Mouth/Throat: Uvula is midline,  oropharynx is clear and moist and mucous membranes are normal.  Eyes: Conjunctivae, EOM and lids are normal. Pupils are equal, round, and reactive to light. Lids are everted and swept, no foreign bodies found.  Neck: Trachea normal and normal range of motion. Neck supple. Carotid bruit is not present. No mass and no thyromegaly present.  Cardiovascular: Normal rate, regular rhythm, S1 normal, S2 normal, normal heart sounds, intact distal pulses and normal pulses.  Exam reveals no gallop and no friction rub.   No murmur heard. Pulmonary/Chest: Effort normal and breath sounds normal. Not tachypneic. No respiratory distress. She has no decreased breath sounds. She has no wheezes. She has no rhonchi. She has no rales.  Abdominal: Soft. Normal appearance and bowel sounds are normal. There is no tenderness.  Neurological: She is alert.  Skin: Skin is warm, dry and intact. No rash noted.  Psychiatric: Her speech is normal and behavior is normal. Judgment and thought content normal. Her mood appears not anxious. Cognition and memory are normal. She does not exhibit a depressed mood.          Assessment & Plan:

## 2012-11-30 ENCOUNTER — Encounter: Payer: Self-pay | Admitting: *Deleted

## 2012-12-09 ENCOUNTER — Other Ambulatory Visit: Payer: Self-pay | Admitting: Family Medicine

## 2012-12-09 NOTE — Telephone Encounter (Signed)
Last office visit 11/29/2012.  Ok to refill? 

## 2012-12-12 ENCOUNTER — Other Ambulatory Visit: Payer: Self-pay | Admitting: Family Medicine

## 2012-12-13 ENCOUNTER — Encounter: Payer: Self-pay | Admitting: Family Medicine

## 2012-12-30 ENCOUNTER — Ambulatory Visit (INDEPENDENT_AMBULATORY_CARE_PROVIDER_SITE_OTHER): Payer: BC Managed Care – PPO | Admitting: Family Medicine

## 2012-12-30 ENCOUNTER — Encounter: Payer: Self-pay | Admitting: Family Medicine

## 2012-12-30 VITALS — BP 120/80 | HR 87 | Temp 98.3°F | Ht 61.5 in | Wt 147.0 lb

## 2012-12-30 DIAGNOSIS — I10 Essential (primary) hypertension: Secondary | ICD-10-CM

## 2012-12-30 NOTE — Progress Notes (Signed)
  Subjective:    Patient ID: Katie Dennis, female    DOB: 1955/05/10, 57 y.o.   MRN: [id number]  HPI 57 year old female presents for BP follow up. At last OV office numbers were good but home numbers on reliable cuff remained elevated consistently.  Hypertension:  Well controlled at today's OV now on max losartan/HCTZ and now amlodipine 5 mg daily Using medication without problems or lightheadedness:  Chest pain with exertion:None Edema:None Short of breath:None Average home BPs: Has not been checking regualrly at home... Has been under a lot of stress. Other issues:    Review of Systems  Constitutional: Negative for fever and fatigue.  HENT: Negative for ear pain.   Eyes: Negative for pain.  Respiratory: Negative for chest tightness and shortness of breath.   Cardiovascular: Negative for chest pain, palpitations and leg swelling.  Gastrointestinal: Negative for abdominal pain.  Genitourinary: Negative for dysuria.       Objective:   Physical Exam  Constitutional: Vital signs are normal. She appears well-developed and well-nourished. She is cooperative.  Non-toxic appearance. She does not appear ill. No distress.  HENT:  Head: Normocephalic.  Right Ear: Hearing, tympanic membrane, external ear and ear canal normal. Tympanic membrane is not erythematous, not retracted and not bulging.  Left Ear: Hearing, tympanic membrane, external ear and ear canal normal. Tympanic membrane is not erythematous, not retracted and not bulging.  Nose: No mucosal edema or rhinorrhea. Right sinus exhibits no maxillary sinus tenderness and no frontal sinus tenderness. Left sinus exhibits no maxillary sinus tenderness and no frontal sinus tenderness.  Mouth/Throat: Uvula is midline, oropharynx is clear and moist and mucous membranes are normal.  Eyes: Conjunctivae, EOM and lids are normal. Pupils are equal, round, and reactive to light. Lids are everted and swept, no foreign bodies found.  Neck: Trachea  normal and normal range of motion. Neck supple. Carotid bruit is not present. No mass and no thyromegaly present.  Cardiovascular: Normal rate, regular rhythm, S1 normal, S2 normal, normal heart sounds, intact distal pulses and normal pulses.  Exam reveals no gallop and no friction rub.   No murmur heard. Pulmonary/Chest: Effort normal and breath sounds normal. Not tachypneic. No respiratory distress. She has no decreased breath sounds. She has no wheezes. She has no rhonchi. She has no rales.  Abdominal: Soft. Normal appearance and bowel sounds are normal. There is no tenderness.  Neurological: She is alert.  Skin: Skin is warm, dry and intact. No rash noted.  Psychiatric: Her speech is normal and behavior is normal. Judgment and thought content normal. Her mood appears not anxious. Cognition and memory are normal. She does not exhibit a depressed mood.          Assessment & Plan:

## 2012-12-30 NOTE — Progress Notes (Signed)
Pre-visit discussion using our clinic review tool. No additional management support is needed unless otherwise documented below in the visit note.  

## 2012-12-30 NOTE — Patient Instructions (Signed)
Continue current BP meds. Follow BP at home.. Call if greater than 140/90. Schedule CPX with labs prior in  Mid to late 05/2013.

## 2012-12-30 NOTE — Assessment & Plan Note (Signed)
Well controlled. Continue current medication.  

## 2013-02-07 ENCOUNTER — Other Ambulatory Visit: Payer: Self-pay | Admitting: Family Medicine

## 2013-02-08 NOTE — Telephone Encounter (Signed)
Ok to refill 

## 2013-02-14 ENCOUNTER — Other Ambulatory Visit: Payer: Self-pay | Admitting: Family Medicine

## 2013-02-14 NOTE — Telephone Encounter (Signed)
Last office visit 12/30/2012.  Ok to refill?

## 2013-02-15 NOTE — Telephone Encounter (Signed)
Called to [facility]

## 2013-04-18 ENCOUNTER — Other Ambulatory Visit: Payer: Self-pay | Admitting: Family Medicine

## 2013-05-08 ENCOUNTER — Telehealth: Payer: Self-pay | Admitting: Family Medicine

## 2013-05-08 DIAGNOSIS — E7800 Pure hypercholesterolemia, unspecified: Secondary | ICD-10-CM

## 2013-05-08 DIAGNOSIS — R7303 Prediabetes: Secondary | ICD-10-CM

## 2013-05-08 NOTE — Telephone Encounter (Signed)
Message copied by Jinny Sanders on Mon May 08, 2013 11:26 PM ------      Message from: Ellamae Sia      Created: Mon May 01, 2013 12:40 PM      Regarding: Lab orders for Tuesday, 3.31.15       Patient is scheduled for CPX labs, please order future labs, Thanks , Terri       ------

## 2013-05-09 ENCOUNTER — Other Ambulatory Visit (INDEPENDENT_AMBULATORY_CARE_PROVIDER_SITE_OTHER): Payer: BC Managed Care – PPO

## 2013-05-09 DIAGNOSIS — E7800 Pure hypercholesterolemia, unspecified: Secondary | ICD-10-CM

## 2013-05-09 DIAGNOSIS — R7303 Prediabetes: Secondary | ICD-10-CM

## 2013-05-09 DIAGNOSIS — R7309 Other abnormal glucose: Secondary | ICD-10-CM

## 2013-05-09 LAB — COMPREHENSIVE METABOLIC PANEL
ALBUMIN: 4.2 g/dL (ref 3.5–5.2)
ALT: 38 U/L — AB (ref 0–35)
AST: 25 U/L (ref 0–37)
Alkaline Phosphatase: 79 U/L (ref 39–117)
BUN: 14 mg/dL (ref 6–23)
CALCIUM: 9.6 mg/dL (ref 8.4–10.5)
CHLORIDE: 100 meq/L (ref 96–112)
CO2: 28 mEq/L (ref 19–32)
Creatinine, Ser: 0.9 mg/dL (ref 0.4–1.2)
GFR: 65.95 mL/min (ref >60.00)
Glucose, Bld: 97 mg/dL (ref 70–99)
POTASSIUM: 4.3 meq/L (ref 3.5–5.1)
SODIUM: 139 meq/L (ref 135–145)
TOTAL PROTEIN: 7.1 g/dL (ref 6.0–8.3)
Total Bilirubin: 0.8 mg/dL (ref 0.3–1.2)

## 2013-05-09 LAB — LIPID PANEL
CHOL/HDL RATIO: 4
Cholesterol: 222 mg/dL — ABNORMAL HIGH (ref 0–200)
HDL: 55.4 mg/dL (ref >39.00)
LDL Cholesterol: 146 mg/dL — ABNORMAL HIGH (ref 0–99)
Triglycerides: 105 mg/dL (ref 0.0–149.0)
VLDL: 21 mg/dL (ref 0.0–40.0)

## 2013-05-09 LAB — HEMOGLOBIN A1C: Hgb A1c MFr Bld: 5.7 % (ref 4.6–6.5)

## 2013-05-16 ENCOUNTER — Encounter: Payer: Self-pay | Admitting: Internal Medicine

## 2013-05-16 ENCOUNTER — Ambulatory Visit (INDEPENDENT_AMBULATORY_CARE_PROVIDER_SITE_OTHER): Payer: BC Managed Care – PPO | Admitting: Family Medicine

## 2013-05-16 ENCOUNTER — Other Ambulatory Visit (HOSPITAL_COMMUNITY)
Admission: RE | Admit: 2013-05-16 | Discharge: 2013-05-16 | Disposition: A | Discharge status: Home / Self Care | Payer: BC Managed Care – PPO | Source: Ambulatory Visit | Attending: Family Medicine | Admitting: Family Medicine

## 2013-05-16 ENCOUNTER — Encounter: Payer: Self-pay | Admitting: Family Medicine

## 2013-05-16 VITALS — BP 100/62 | HR 83 | Temp 98.4°F | Ht 61.5 in | Wt 141.5 lb

## 2013-05-16 DIAGNOSIS — I10 Essential (primary) hypertension: Secondary | ICD-10-CM

## 2013-05-16 DIAGNOSIS — R7309 Other abnormal glucose: Secondary | ICD-10-CM

## 2013-05-16 DIAGNOSIS — E7800 Pure hypercholesterolemia, unspecified: Secondary | ICD-10-CM

## 2013-05-16 DIAGNOSIS — Z01419 Encounter for gynecological examination (general) (routine) without abnormal findings: Secondary | ICD-10-CM | POA: Insufficient documentation

## 2013-05-16 DIAGNOSIS — R7303 Prediabetes: Secondary | ICD-10-CM

## 2013-05-16 DIAGNOSIS — M159 Polyosteoarthritis, unspecified: Secondary | ICD-10-CM | POA: Insufficient documentation

## 2013-05-16 DIAGNOSIS — Z0000 Encounter for general adult medical examination without abnormal findings: Secondary | ICD-10-CM

## 2013-05-16 DIAGNOSIS — Z124 Encounter for screening for malignant neoplasm of cervix: Secondary | ICD-10-CM

## 2013-05-16 DIAGNOSIS — Z1211 Encounter for screening for malignant neoplasm of colon: Secondary | ICD-10-CM

## 2013-05-16 NOTE — Progress Notes (Signed)
Pre visit review using our clinic review tool, if applicable. No additional management support is needed unless otherwise documented below in the visit note. 

## 2013-05-16 NOTE — Assessment & Plan Note (Signed)
Well controlled. Continue current medication.  
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
## 2013-05-16 NOTE — Assessment & Plan Note (Signed)
Not at goal, work on lifestyle changes, start red yeast rice.

## 2013-05-16 NOTE — Assessment & Plan Note (Signed)
Likely cause of pain in right shoulder hip and knee.  Pt not interested in X-ray or steroid injection. NSAIDs did not help in past. Will try to treat nighttime pain with increase in tramadol at bed to 100 mg daily.

## 2013-05-16 NOTE — Patient Instructions (Addendum)
Consider red yeast rice 1200 mg twice daily to lower cholesterol. Work on low cholesterol diet and get back to exercise. Schedule mammogram on your own. Stop at front desk to set up colonoscopy.

## 2013-05-16 NOTE — Assessment & Plan Note (Signed)
Stable control. 

## 2013-05-17 ENCOUNTER — Telehealth: Payer: Self-pay | Admitting: Family Medicine

## 2013-05-17 NOTE — Telephone Encounter (Signed)
Relevant patient education assigned to patient using Emmi. ° °

## 2013-05-18 ENCOUNTER — Encounter: Payer: Self-pay | Admitting: *Deleted

## 2013-05-26 ENCOUNTER — Other Ambulatory Visit: Payer: Self-pay

## 2013-05-26 MED ORDER — TRAMADOL HCL 50 MG PO TABS: ORAL_TABLET | ORAL | Status: DC

## 2013-05-26 NOTE — Telephone Encounter (Signed)
Pt left v/m requesting tramadol to [facility]; taking 2 tramadol at night is really helping.

## 2013-05-29 NOTE — Telephone Encounter (Signed)
[facility]. Gasper Sells, MA Student

## 2013-06-13 ENCOUNTER — Encounter: Payer: BC Managed Care – PPO | Admitting: Internal Medicine

## 2013-06-19 ENCOUNTER — Other Ambulatory Visit: Payer: Self-pay | Admitting: Family Medicine

## 2013-06-26 ENCOUNTER — Other Ambulatory Visit: Payer: Self-pay | Admitting: Family Medicine

## 2013-06-27 NOTE — Telephone Encounter (Signed)
Last office visit 05/16/2013.  Ok to refill?

## 2013-07-10 ENCOUNTER — Other Ambulatory Visit: Payer: Self-pay | Admitting: Family Medicine

## 2013-07-10 NOTE — Telephone Encounter (Signed)
Last office visit 05/16/2013.  Last refilled 05/26/2013 for #90. Ok to refill?

## 2013-07-11 NOTE — Telephone Encounter (Signed)
Called [facility] [address]

## 2013-08-25 ENCOUNTER — Other Ambulatory Visit: Payer: Self-pay | Admitting: Family Medicine

## 2013-08-26 NOTE — Telephone Encounter (Signed)
Last office visit 05/26/2013.  Last refilled 07/11/2013 for #90 with no refills.  Ok to refill?

## 2013-08-28 NOTE — Telephone Encounter (Signed)
Called to [facility].

## 2013-08-31 ENCOUNTER — Other Ambulatory Visit: Payer: Self-pay | Admitting: Family Medicine

## 2013-09-01 NOTE — Telephone Encounter (Signed)
Last office visit 05/16/2013.  Last refilled 06/27/2013 for #60 with 1 refill.  Dr. Diona Browner out of the office.  Ok to refill?

## 2013-10-05 ENCOUNTER — Other Ambulatory Visit: Payer: Self-pay | Admitting: Family Medicine

## 2013-10-06 NOTE — Telephone Encounter (Signed)
Called [facility] [address]

## 2013-10-06 NOTE — Telephone Encounter (Signed)
Last office visit 05/16/2013.  Last refilled 08/28/2013 for #90 with no refills.  Ok to refill?

## 2013-10-28 ENCOUNTER — Other Ambulatory Visit: Payer: Self-pay | Admitting: Family Medicine

## 2013-10-28 NOTE — Telephone Encounter (Signed)
Last office visit 05/16/2013.  Last refilled 07/242015 for #60 with 1 refill.  Ok to refill?

## 2013-10-30 ENCOUNTER — Other Ambulatory Visit: Payer: Self-pay | Admitting: Family Medicine

## 2013-11-03 ENCOUNTER — Other Ambulatory Visit: Payer: Self-pay | Admitting: Family Medicine

## 2013-11-14 ENCOUNTER — Other Ambulatory Visit: Payer: Self-pay | Admitting: Family Medicine

## 2013-12-07 ENCOUNTER — Other Ambulatory Visit: Payer: Self-pay | Admitting: Family Medicine

## 2013-12-08 NOTE — Telephone Encounter (Signed)
Last office visit 05/16/2013.  Last refilled 10/06/2013 for #90 with no refills. Ok to refill?

## 2013-12-08 NOTE — Telephone Encounter (Signed)
Called [facility] [address]

## 2013-12-26 ENCOUNTER — Other Ambulatory Visit: Payer: Self-pay | Admitting: Family Medicine

## 2013-12-26 NOTE — Telephone Encounter (Signed)
Last office visit 05/16/2013.  Last refilled 10/29/2013 for #60 with 1 refill.  Ok to refill?

## 2014-02-22 ENCOUNTER — Other Ambulatory Visit: Payer: Self-pay | Admitting: Family Medicine

## 2014-02-22 NOTE — Telephone Encounter (Signed)
Last office visit 05/16/2013.  Last refilled 12/26/2013 for #60 with 1 refill.  Ok to refill?

## 2014-02-25 ENCOUNTER — Other Ambulatory Visit: Payer: Self-pay | Admitting: Family Medicine

## 2014-03-30 ENCOUNTER — Other Ambulatory Visit: Payer: Self-pay | Admitting: Family Medicine

## 2014-03-30 NOTE — Telephone Encounter (Signed)
Last office visit 05/16/2013.  Last refilled 12/08/2013 for #90 with no refills.  Ok to refill?

## 2014-03-30 NOTE — Telephone Encounter (Signed)
Called [facility] [address]

## 2014-04-23 ENCOUNTER — Other Ambulatory Visit: Payer: Self-pay | Admitting: Family Medicine

## 2014-04-23 NOTE — Telephone Encounter (Signed)
Last office visit 05/16/2013. No future appointments scheduled.  Last refilled 02/22/2014 for #60 with 1 refill.  Ok to refill?

## 2014-05-03 ENCOUNTER — Other Ambulatory Visit: Payer: Self-pay | Admitting: Family Medicine

## 2014-05-03 NOTE — Telephone Encounter (Signed)
LVM on pt cell to c/b and schedule cpe and labs

## 2014-05-03 NOTE — Telephone Encounter (Signed)
Please call and schedule CPE with fasting labs prior for sometime after April 7 with Dr. Diona Browner.

## 2014-05-08 NOTE — Telephone Encounter (Signed)
Lab 6/1 cpx 6/3

## 2014-05-21 ENCOUNTER — Other Ambulatory Visit: Payer: Self-pay | Admitting: Family Medicine

## 2014-06-11 ENCOUNTER — Other Ambulatory Visit: Payer: Self-pay | Admitting: Family Medicine

## 2014-06-11 NOTE — Telephone Encounter (Signed)
Last office visit 05/16/2013.  Last refilled 03/30/2014 for #90 with no refills.  CPE scheduled 07/13/2014.  Ok to refill?

## 2014-06-12 NOTE — Telephone Encounter (Signed)
Medication phoned to pharmacy.  

## 2014-06-16 ENCOUNTER — Other Ambulatory Visit: Payer: Self-pay | Admitting: Family Medicine

## 2014-06-16 NOTE — Telephone Encounter (Signed)
Last office visit 05/16/2013. CPE 07/13/2014.   Flexeril last refilled 04/23/2014 for #60 with 1 refill. Ok to refill?

## 2014-07-10 ENCOUNTER — Telehealth: Payer: Self-pay | Admitting: Family Medicine

## 2014-07-10 DIAGNOSIS — E7800 Pure hypercholesterolemia, unspecified: Secondary | ICD-10-CM

## 2014-07-10 DIAGNOSIS — R7303 Prediabetes: Secondary | ICD-10-CM

## 2014-07-10 NOTE — Telephone Encounter (Signed)
-----   Message from Ellamae Sia sent at 07/10/2014 12:47 PM EDT ----- Regarding: Lab orders for Wednesday, 6.1.16 Patient is scheduled for CPX labs, please order future labs, Thanks , Karna Christmas

## 2014-07-11 ENCOUNTER — Other Ambulatory Visit (INDEPENDENT_AMBULATORY_CARE_PROVIDER_SITE_OTHER): Payer: BC Managed Care – PPO

## 2014-07-11 DIAGNOSIS — E7800 Pure hypercholesterolemia, unspecified: Secondary | ICD-10-CM

## 2014-07-11 DIAGNOSIS — R7309 Other abnormal glucose: Secondary | ICD-10-CM | POA: Diagnosis not present

## 2014-07-11 DIAGNOSIS — R7303 Prediabetes: Secondary | ICD-10-CM

## 2014-07-11 LAB — LIPID PANEL
Cholesterol: 204 mg/dL — ABNORMAL HIGH (ref 0–200)
HDL: 48 mg/dL (ref >39.00)
LDL Cholesterol: 139 mg/dL — ABNORMAL HIGH (ref 0–99)
NonHDL: 156
Total CHOL/HDL Ratio: 4
Triglycerides: 84 mg/dL (ref 0.0–149.0)
VLDL: 16.8 mg/dL (ref 0.0–40.0)

## 2014-07-11 LAB — COMPREHENSIVE METABOLIC PANEL
ALT: 28 U/L (ref 0–35)
AST: 19 U/L (ref 0–37)
Albumin: 4 g/dL (ref 3.5–5.2)
Alkaline Phosphatase: 83 U/L (ref 39–117)
BUN: 11 mg/dL (ref 6–23)
CHLORIDE: 105 meq/L (ref 96–112)
CO2: 26 meq/L (ref 19–32)
Calcium: 9 mg/dL (ref 8.4–10.5)
Creatinine, Ser: 0.96 mg/dL (ref 0.40–1.20)
GFR: 63.32 mL/min (ref >60.00)
Glucose, Bld: 101 mg/dL — ABNORMAL HIGH (ref 70–99)
Potassium: 3.4 mEq/L — ABNORMAL LOW (ref 3.5–5.1)
Sodium: 138 mEq/L (ref 135–145)
TOTAL PROTEIN: 6.6 g/dL (ref 6.0–8.3)
Total Bilirubin: 0.5 mg/dL (ref 0.2–1.2)

## 2014-07-11 LAB — HEMOGLOBIN A1C: Hgb A1c MFr Bld: 5.4 % (ref 4.6–6.5)

## 2014-07-13 ENCOUNTER — Encounter: Payer: Self-pay | Admitting: Family Medicine

## 2014-07-13 ENCOUNTER — Ambulatory Visit (INDEPENDENT_AMBULATORY_CARE_PROVIDER_SITE_OTHER): Payer: BC Managed Care – PPO | Admitting: Family Medicine

## 2014-07-13 VITALS — BP 120/72 | HR 97 | Temp 98.9°F | Ht 61.0 in | Wt 145.5 lb

## 2014-07-13 DIAGNOSIS — Z0000 Encounter for general adult medical examination without abnormal findings: Secondary | ICD-10-CM | POA: Diagnosis not present

## 2014-07-13 DIAGNOSIS — R7309 Other abnormal glucose: Secondary | ICD-10-CM | POA: Diagnosis not present

## 2014-07-13 DIAGNOSIS — E876 Hypokalemia: Secondary | ICD-10-CM | POA: Diagnosis not present

## 2014-07-13 DIAGNOSIS — I10 Essential (primary) hypertension: Secondary | ICD-10-CM

## 2014-07-13 DIAGNOSIS — M797 Fibromyalgia: Secondary | ICD-10-CM | POA: Diagnosis not present

## 2014-07-13 DIAGNOSIS — R7303 Prediabetes: Secondary | ICD-10-CM

## 2014-07-13 DIAGNOSIS — Z1211 Encounter for screening for malignant neoplasm of colon: Secondary | ICD-10-CM

## 2014-07-13 MED ORDER — DULOXETINE HCL 30 MG PO CPEP: ORAL_CAPSULE | ORAL | Status: DC

## 2014-07-13 MED ORDER — AMITRIPTYLINE HCL 25 MG PO TABS: 25.0000 mg | ORAL_TABLET | Freq: Every day | ORAL | Status: DC

## 2014-07-13 NOTE — Progress Notes (Signed)
Pre visit review using our clinic review tool, if applicable. No additional management support is needed unless otherwise documented below in the visit note. 

## 2014-07-13 NOTE — Assessment & Plan Note (Signed)
Well controlled on cymbalta but possible mild SE. Pt wishes to change. Wean off.  Treat with amitryptiline at bedtime, will like need to titrate up.

## 2014-07-13 NOTE — Assessment & Plan Note (Signed)
Likely due to HCTZ.  Increase potassium in diet.

## 2014-07-13 NOTE — Assessment & Plan Note (Signed)
Good control

## 2014-07-13 NOTE — Patient Instructions (Addendum)
Increase high potassium foods. Decrease cymbalta to 30 mg daily.  Start amitriptyline at beditme. After 1 week stop cymbalta.  If pain not well controlled we can increase amitriptyline at that itme. Stop at lab on way out to pick up stool cards.  Set up mammogram on your own.  Hypokalemia Hypokalemia means that the amount of potassium in the blood is lower than normal.Potassium is a chemical, called an electrolyte, that helps regulate the amount of fluid in the body. It also stimulates muscle contraction and helps nerves function properly.Most of the body's potassium is inside of cells, and only a very small amount is in the blood. Because the amount in the blood is so small, minor changes can be life-threatening. CAUSES  Antibiotics.  Diarrhea or vomiting.  Using laxatives too much, which can cause diarrhea.  Chronic kidney disease.  Water pills (diuretics).  Eating disorders (bulimia).  Low magnesium level.  Sweating a lot. SIGNS AND SYMPTOMS  Weakness.  Constipation.  Fatigue.  Muscle cramps.  Mental confusion.  Skipped heartbeats or irregular heartbeat (palpitations).  Tingling or numbness. DIAGNOSIS  Your health care provider can diagnose hypokalemia with blood tests. In addition to checking your potassium level, your health care provider may also check other lab tests. TREATMENT Hypokalemia can be treated with potassium supplements taken by mouth or adjustments in your current medicines. If your potassium level is very low, you may need to get potassium through a vein (IV) and be monitored in the hospital. A diet high in potassium is also helpful. Foods high in potassium are:  Nuts, such as peanuts and pistachios.  Seeds, such as sunflower seeds and pumpkin seeds.  Peas, lentils, and lima beans.  Whole grain and bran cereals and breads.  Fresh fruit and vegetables, such as apricots, avocado, bananas, cantaloupe, kiwi, oranges, tomatoes, asparagus, and  potatoes.  Orange and tomato juices.  Red meats.  Fruit yogurt. HOME CARE INSTRUCTIONS  Take all medicines as prescribed by your health care provider.  Maintain a healthy diet by including nutritious food, such as fruits, vegetables, nuts, whole grains, and lean meats.  If you are taking a laxative, be sure to follow the directions on the label. SEEK MEDICAL CARE IF:  Your weakness gets worse.  You feel your heart pounding or racing.  You are vomiting or having diarrhea.  You are diabetic and having trouble keeping your blood glucose in the normal range. SEEK IMMEDIATE MEDICAL CARE IF:  You have chest pain, shortness of breath, or dizziness.  You are vomiting or having diarrhea for more than 2 days.  You faint. MAKE SURE YOU:   Understand these instructions.  Will watch your condition.  Will get help right away if you are not doing well or get worse. Document Released: 01/26/2005 Document Revised: 11/16/2012 Document Reviewed: 07/29/2012 [facility] Patient Information 2015 [place], [place]. This information is not intended to replace advice given to you by your health care provider. Make sure you discuss any questions you have with your health care provider.

## 2014-07-13 NOTE — Assessment & Plan Note (Signed)
Well controlled. Continue current medication. Encouraged exercise, weight loss, healthy eating habits.  
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
## 2014-07-25 ENCOUNTER — Other Ambulatory Visit (INDEPENDENT_AMBULATORY_CARE_PROVIDER_SITE_OTHER): Payer: BC Managed Care – PPO

## 2014-07-25 DIAGNOSIS — Z1211 Encounter for screening for malignant neoplasm of colon: Secondary | ICD-10-CM | POA: Diagnosis not present

## 2014-07-25 LAB — FECAL OCCULT BLOOD, GUAIAC: Fecal Occult Blood: NEGATIVE

## 2014-07-25 LAB — FECAL OCCULT BLOOD, IMMUNOCHEMICAL: FECAL OCCULT BLD: NEGATIVE

## 2014-07-26 ENCOUNTER — Encounter: Payer: Self-pay | Admitting: *Deleted

## 2014-07-26 ENCOUNTER — Encounter: Payer: Self-pay | Admitting: Family Medicine

## 2014-08-16 ENCOUNTER — Other Ambulatory Visit: Payer: Self-pay | Admitting: Family Medicine

## 2014-08-19 ENCOUNTER — Other Ambulatory Visit: Payer: Self-pay | Admitting: Family Medicine

## 2014-08-19 NOTE — Telephone Encounter (Signed)
Last office visit 07/13/2014.  Last refilled 10/29/2013 for #60 with 1 refill.  Ok to refill?

## 2014-08-21 ENCOUNTER — Encounter: Payer: Self-pay | Admitting: Family Medicine

## 2014-08-21 ENCOUNTER — Ambulatory Visit (INDEPENDENT_AMBULATORY_CARE_PROVIDER_SITE_OTHER): Payer: BC Managed Care – PPO | Admitting: Family Medicine

## 2014-08-21 ENCOUNTER — Other Ambulatory Visit: Payer: Self-pay | Admitting: Family Medicine

## 2014-08-21 VITALS — BP 100/64 | HR 86 | Temp 98.4°F | Ht 61.0 in | Wt 146.5 lb

## 2014-08-21 DIAGNOSIS — M797 Fibromyalgia: Secondary | ICD-10-CM | POA: Diagnosis not present

## 2014-08-21 MED ORDER — VENLAFAXINE HCL ER 37.5 MG PO CP24: ORAL_CAPSULE | ORAL | Status: DC

## 2014-08-21 NOTE — Progress Notes (Signed)
Pre visit review using our clinic review tool, if applicable. No additional management support is needed unless otherwise documented below in the visit note. 

## 2014-08-21 NOTE — Assessment & Plan Note (Signed)
Poor control. Will try changing to venlafaxine.  Use cyclobenzaprine and tramadol prn. If not improving as much as likely can consider adding lyrica or neurontin.

## 2014-08-21 NOTE — Patient Instructions (Addendum)
Start venlafaxine XR 37.5 mg daily x 1 week then increase maintenance dose 75 mg daily.  Make sure to get adequate sleep, water, healthy diet.

## 2014-08-21 NOTE — Progress Notes (Signed)
   Subjective:    Patient ID: Katie Dennis, female    DOB: 05-21-1955, 59 y.o.   MRN: [id number]  HPI  59 year old female presents for 1 month follow up on fibromyalgia. At last OV given SE to cymbalta (craving sweets, swelling in legs).. changed to amitriptyline 25 mg at bedtime.  She reports  It and that she was unable to take given insmonia SE. She only took it for 1 week before stopping. She has been off both Cymbalta and amitriptyline for 3 weeks.  She reports her pain is worse. Using tramadol  When really bad, usually couple times a week.  She does not use frequently to avoid taking pain med frequently.  Uses cyclobenzaprine nightly.  She reports her mood is not well controlled. She is depressed and anxious.   She has never tried anything other than prozac. Minimal help.  Has never tried neurontin or lyrica.    Review of Systems  Constitutional: Negative for fever and fatigue.  HENT: Negative for ear pain.   Eyes: Negative for pain.  Respiratory: Negative for chest tightness and shortness of breath.   Cardiovascular: Negative for chest pain, palpitations and leg swelling.  Gastrointestinal: Negative for abdominal pain.  Genitourinary: Negative for dysuria.       Objective:   Physical Exam  Constitutional: Vital signs are normal. She appears well-developed and well-nourished. She is cooperative.  Non-toxic appearance. She does not appear ill. No distress.  HENT:  Head: Normocephalic.  Right Ear: Hearing, tympanic membrane, external ear and ear canal normal. Tympanic membrane is not erythematous, not retracted and not bulging.  Left Ear: Hearing, tympanic membrane, external ear and ear canal normal. Tympanic membrane is not erythematous, not retracted and not bulging.  Nose: No mucosal edema or rhinorrhea. Right sinus exhibits no maxillary sinus tenderness and no frontal sinus tenderness. Left sinus exhibits no maxillary sinus tenderness and no frontal sinus tenderness.    Mouth/Throat: Uvula is midline, oropharynx is clear and moist and mucous membranes are normal.  Eyes: Conjunctivae, EOM and lids are normal. Pupils are equal, round, and reactive to light. Lids are everted and swept, no foreign bodies found.  Neck: Trachea normal and normal range of motion. Neck supple. Carotid bruit is not present. No thyroid mass and no thyromegaly present.  Cardiovascular: Normal rate, regular rhythm, S1 normal, S2 normal, normal heart sounds, intact distal pulses and normal pulses.  Exam reveals no gallop and no friction rub.   No murmur heard. Pulmonary/Chest: Effort normal and breath sounds normal. No tachypnea. No respiratory distress. She has no decreased breath sounds. She has no wheezes. She has no rhonchi. She has no rales.  Abdominal: Soft. Normal appearance and bowel sounds are normal. There is no tenderness.  Neurological: She is alert.  ttp diffusely over body  Skin: Skin is warm, dry and intact. No rash noted.  Psychiatric: Her speech is normal and behavior is normal. Judgment and thought content normal. Her mood appears not anxious. Cognition and memory are normal. She does not exhibit a depressed mood.          Assessment & Plan:

## 2014-08-22 NOTE — Telephone Encounter (Signed)
Last office visit 08/21/2014.  Last refilled 06/11/2014 for #90 with no refills.  Ok to refill?

## 2014-08-23 NOTE — Telephone Encounter (Signed)
Called [facility] [address]

## 2014-08-23 NOTE — Addendum Note (Signed)
Addended by: Carter Kitten on: 08/23/2014 08:25 AM   Modules accepted: Medications

## 2014-09-08 ENCOUNTER — Other Ambulatory Visit: Payer: Self-pay | Admitting: Family Medicine

## 2014-09-09 NOTE — Telephone Encounter (Signed)
Last office visit 08/21/2014.  Last refilled 10/29/2013 for #60 with 1 refill.  Ok to refill?

## 2014-09-21 ENCOUNTER — Encounter: Payer: Self-pay | Admitting: Family Medicine

## 2014-09-21 ENCOUNTER — Ambulatory Visit (INDEPENDENT_AMBULATORY_CARE_PROVIDER_SITE_OTHER): Payer: BC Managed Care – PPO | Admitting: Family Medicine

## 2014-09-21 VITALS — BP 102/62 | HR 99 | Temp 98.5°F | Wt 143.0 lb

## 2014-09-21 DIAGNOSIS — M797 Fibromyalgia: Secondary | ICD-10-CM

## 2014-09-21 NOTE — Assessment & Plan Note (Signed)
Dramatic improvement with venlafaxine. Continue. Encouraged exercise, weight loss, healthy eating habits.

## 2014-09-21 NOTE — Progress Notes (Signed)
Pre visit review using our clinic review tool, if applicable. No additional management support is needed unless otherwise documented below in the visit note. 

## 2014-09-21 NOTE — Progress Notes (Signed)
   Subjective:    Patient ID: Katie Dennis, female    DOB: [birth year], 59 y.o.   MRN: [id number]  HPI 59 year old female presents for 1 month follow up on  Fibromyalgia.  At last OV on 08/21/2014 we changed her from cymbalta  and amitryptiline( SE and ineffective)  to venlafaxine and continued tramadol and cyclobenzaprine.   Today she reports she has significant improvement in swelling, clear thinking, body pain. Some is due to resolving SE to cymbalta and amitryptiline. She reports 90% improvement in body pain in general. She is now on 75 mg daily.  Sleeping  Better at night.  Less fatigue.Mood is much better, no suicidal thought, no longer feels hopeless.  No SE to venlafaxine.    Wt Readings from Last 3 Encounters:  09/21/14 143 lb (64.864 kg)  08/21/14 146 lb 8 oz (66.452 kg)  07/13/14 145 lb 8 oz (65.998 kg)     PHQ decreased from 13 to 2 with venlafaxine.   Review of Systems  Constitutional: Negative for fever and fatigue.  HENT: Negative for ear pain.   Eyes: Negative for pain.  Respiratory: Negative for chest tightness and shortness of breath.   Cardiovascular: Negative for chest pain, palpitations and leg swelling.  Gastrointestinal: Negative for abdominal pain.  Genitourinary: Negative for dysuria.       Objective:   Physical Exam  Constitutional: Vital signs are normal. She appears well-developed and well-nourished. She is cooperative.  Non-toxic appearance. She does not appear ill. No distress.  HENT:  Head: Normocephalic.  Right Ear: Hearing, tympanic membrane, external ear and ear canal normal. Tympanic membrane is not erythematous, not retracted and not bulging.  Left Ear: Hearing, tympanic membrane, external ear and ear canal normal. Tympanic membrane is not erythematous, not retracted and not bulging.  Nose: No mucosal edema or rhinorrhea. Right sinus exhibits no maxillary sinus tenderness and no frontal sinus tenderness. Left sinus exhibits no maxillary  sinus tenderness and no frontal sinus tenderness.  Mouth/Throat: Uvula is midline, oropharynx is clear and moist and mucous membranes are normal.  Eyes: Conjunctivae, EOM and lids are normal. Pupils are equal, round, and reactive to light. Lids are everted and swept, no foreign bodies found.  Neck: Trachea normal and normal range of motion. Neck supple. Carotid bruit is not present. No thyroid mass and no thyromegaly present.  Cardiovascular: Normal rate, regular rhythm, S1 normal, S2 normal, normal heart sounds, intact distal pulses and normal pulses.  Exam reveals no gallop and no friction rub.   No murmur heard. Pulmonary/Chest: Effort normal and breath sounds normal. No tachypnea. No respiratory distress. She has no decreased breath sounds. She has no wheezes. She has no rhonchi. She has no rales.  Abdominal: Soft. Normal appearance and bowel sounds are normal. There is no tenderness.  Neurological: She is alert.  Skin: Skin is warm, dry and intact. No rash noted.  Psychiatric: Her speech is normal and behavior is normal. Judgment and thought content normal. Her mood appears not anxious. Cognition and memory are normal. She does not exhibit a depressed mood.    Only 4 mildly positive trigger points positive now.      Assessment & Plan:

## 2014-09-21 NOTE — Patient Instructions (Signed)
Continue the venlafaxine  XR 75 mg daily.ork on healthy eating and  regular exercise.

## 2014-10-10 ENCOUNTER — Telehealth: Payer: Self-pay

## 2014-10-10 NOTE — Telephone Encounter (Signed)
I left a message for patient to remind her of being due for a mammogram. I notified patient that if she needed information on how/where to get one scheduled, she could return phone call to the office.

## 2014-10-24 ENCOUNTER — Other Ambulatory Visit: Payer: Self-pay | Admitting: Family Medicine

## 2014-10-24 NOTE — Telephone Encounter (Signed)
Last office visit 09/21/2014.  Last refilled 09/10/2014 for #60 with 1 refill.  Ok to refill?

## 2014-11-27 ENCOUNTER — Other Ambulatory Visit: Payer: Self-pay | Admitting: Family Medicine

## 2014-11-27 NOTE — Telephone Encounter (Signed)
Tramadol called into [facility] [address] 

## 2014-11-27 NOTE — Telephone Encounter (Signed)
Last office visit 09/21/2014.  Last refilled 08/23/2014 for #90 with no refills.  Ok to refill?

## 2015-01-10 ENCOUNTER — Telehealth: Payer: Self-pay

## 2015-01-10 NOTE — Telephone Encounter (Signed)
Left message for patient to return phone call regarding flu shot vaccine.

## 2015-01-16 ENCOUNTER — Other Ambulatory Visit: Payer: Self-pay | Admitting: Family Medicine

## 2015-01-16 NOTE — Telephone Encounter (Signed)
Last office visit 09/21/2014.  Last refilled 10/25/2014 for #60 with no refills.  Ok to refill?

## 2015-02-13 ENCOUNTER — Other Ambulatory Visit: Payer: Self-pay | Admitting: Family Medicine

## 2015-02-14 NOTE — Telephone Encounter (Signed)
Last office visit 09/21/2014.  Last refilled 11/27/2014 for #90 with no refills.  Ok to refill?

## 2015-02-15 NOTE — Telephone Encounter (Signed)
Rx called in to pharmacy. 

## 2015-02-17 ENCOUNTER — Other Ambulatory Visit: Payer: Self-pay | Admitting: Family Medicine

## 2015-03-12 ENCOUNTER — Other Ambulatory Visit: Payer: Self-pay | Admitting: Family Medicine

## 2015-03-16 ENCOUNTER — Other Ambulatory Visit: Payer: Self-pay | Admitting: Family Medicine

## 2015-03-16 NOTE — Telephone Encounter (Signed)
Last office visit 09/21/2014.  Last refilled 01/16/2015 for #60 with 1 refill.  Ok to refill?

## 2015-04-19 ENCOUNTER — Other Ambulatory Visit: Payer: Self-pay | Admitting: *Deleted

## 2015-04-19 MED ORDER — VENLAFAXINE HCL ER 75 MG PO CP24: 75.0000 mg | ORAL_CAPSULE | Freq: Every day | ORAL | Status: DC

## 2015-05-21 ENCOUNTER — Other Ambulatory Visit: Payer: Self-pay | Admitting: Family Medicine

## 2015-05-21 NOTE — Telephone Encounter (Signed)
Last office visit 09/21/2014.  Last refilled 03/18/2015 for #60 with 1 refill.  Ok to refill?

## 2015-07-09 ENCOUNTER — Telehealth: Payer: Self-pay | Admitting: Family Medicine

## 2015-07-09 NOTE — Telephone Encounter (Signed)
Last office visit 09/21/2014.  Last refilled 02/14/2015 for #90 with no refills.  Ok to refill?

## 2015-07-09 NOTE — Telephone Encounter (Signed)
Tramadol called into [facility] [address] 

## 2015-07-09 NOTE — Telephone Encounter (Signed)
Please call and schedule CPE with fasting labs for sometime in June with Dr. Diona Browner.

## 2015-07-09 NOTE — Telephone Encounter (Signed)
Make sure pt has year OV scheduled in 09/2014

## 2015-07-09 NOTE — Telephone Encounter (Signed)
Left message asking pt to call office  °

## 2015-07-16 NOTE — Telephone Encounter (Signed)
Labs 7/27 cpx 8/1 Pt aware

## 2015-07-20 ENCOUNTER — Other Ambulatory Visit: Payer: Self-pay | Admitting: Family Medicine

## 2015-07-20 NOTE — Telephone Encounter (Signed)
Last office visit 09/21/2014.  Last refilled 05/21/2015 for #60 with 1 refill.  Ok to refill?

## 2015-09-03 ENCOUNTER — Telehealth: Payer: Self-pay | Admitting: Family Medicine

## 2015-09-03 DIAGNOSIS — R7303 Prediabetes: Secondary | ICD-10-CM

## 2015-09-03 DIAGNOSIS — Z1159 Encounter for screening for other viral diseases: Secondary | ICD-10-CM

## 2015-09-03 DIAGNOSIS — E7800 Pure hypercholesterolemia, unspecified: Secondary | ICD-10-CM

## 2015-09-03 NOTE — Telephone Encounter (Signed)
-----   Message from Ellamae Sia sent at 09/02/2015  2:56 PM EDT ----- Regarding: Lab orders for Wednesday, 7.26.17 Patient is scheduled for CPX labs, please order future labs, Thanks , Karna Christmas

## 2015-09-04 ENCOUNTER — Telehealth: Payer: Self-pay | Admitting: Family Medicine

## 2015-09-04 ENCOUNTER — Other Ambulatory Visit: Payer: Self-pay | Admitting: Family Medicine

## 2015-09-04 DIAGNOSIS — R7303 Prediabetes: Secondary | ICD-10-CM

## 2015-09-04 DIAGNOSIS — E7800 Pure hypercholesterolemia, unspecified: Secondary | ICD-10-CM

## 2015-09-04 NOTE — Telephone Encounter (Signed)
-----   Message from Ellamae Sia sent at 08/28/2015  3:37 PM EDT ----- Regarding: Lab orders for Thursday, 7.27.17 Patient is scheduled for CPX labs, please order future labs, Thanks , Karna Christmas

## 2015-09-05 ENCOUNTER — Other Ambulatory Visit (INDEPENDENT_AMBULATORY_CARE_PROVIDER_SITE_OTHER): Payer: BC Managed Care – PPO

## 2015-09-05 DIAGNOSIS — R7303 Prediabetes: Secondary | ICD-10-CM

## 2015-09-05 DIAGNOSIS — E7800 Pure hypercholesterolemia, unspecified: Secondary | ICD-10-CM | POA: Diagnosis not present

## 2015-09-05 DIAGNOSIS — Z1159 Encounter for screening for other viral diseases: Secondary | ICD-10-CM

## 2015-09-05 LAB — LIPID PANEL
CHOLESTEROL: 215 mg/dL — AB (ref 0–200)
HDL: 54.5 mg/dL (ref >39.00)
LDL Cholesterol: 129 mg/dL — ABNORMAL HIGH (ref 0–99)
NONHDL: 160.85
Total CHOL/HDL Ratio: 4
Triglycerides: 159 mg/dL — ABNORMAL HIGH (ref 0.0–149.0)
VLDL: 31.8 mg/dL (ref 0.0–40.0)

## 2015-09-05 LAB — COMPREHENSIVE METABOLIC PANEL
ALBUMIN: 4.1 g/dL (ref 3.5–5.2)
ALK PHOS: 87 U/L (ref 39–117)
ALT: 29 U/L (ref 0–35)
AST: 22 U/L (ref 0–37)
BILIRUBIN TOTAL: 0.5 mg/dL (ref 0.2–1.2)
BUN: 11 mg/dL (ref 6–23)
CO2: 30 mEq/L (ref 19–32)
Calcium: 9.6 mg/dL (ref 8.4–10.5)
Chloride: 103 mEq/L (ref 96–112)
Creatinine, Ser: 0.86 mg/dL (ref 0.40–1.20)
GFR: 71.6 mL/min (ref >60.00)
GLUCOSE: 101 mg/dL — AB (ref 70–99)
POTASSIUM: 4.2 meq/L (ref 3.5–5.1)
Sodium: 140 mEq/L (ref 135–145)
TOTAL PROTEIN: 6.7 g/dL (ref 6.0–8.3)

## 2015-09-05 LAB — HEMOGLOBIN A1C: HEMOGLOBIN A1C: 5.5 % (ref 4.6–6.5)

## 2015-09-06 LAB — HEPATITIS C ANTIBODY: HCV AB: NEGATIVE

## 2015-09-10 ENCOUNTER — Encounter: Payer: Self-pay | Admitting: Family Medicine

## 2015-09-10 ENCOUNTER — Ambulatory Visit (INDEPENDENT_AMBULATORY_CARE_PROVIDER_SITE_OTHER): Payer: BC Managed Care – PPO | Admitting: Family Medicine

## 2015-09-10 VITALS — BP 106/72 | HR 86 | Temp 98.6°F | Ht 61.25 in | Wt 140.2 lb

## 2015-09-10 DIAGNOSIS — Z0000 Encounter for general adult medical examination without abnormal findings: Secondary | ICD-10-CM | POA: Diagnosis not present

## 2015-09-10 DIAGNOSIS — E7800 Pure hypercholesterolemia, unspecified: Secondary | ICD-10-CM

## 2015-09-10 DIAGNOSIS — M797 Fibromyalgia: Secondary | ICD-10-CM

## 2015-09-10 DIAGNOSIS — R7303 Prediabetes: Secondary | ICD-10-CM | POA: Diagnosis not present

## 2015-09-10 DIAGNOSIS — I10 Essential (primary) hypertension: Secondary | ICD-10-CM

## 2015-09-10 DIAGNOSIS — Z1211 Encounter for screening for malignant neoplasm of colon: Secondary | ICD-10-CM

## 2015-09-10 DIAGNOSIS — F3341 Major depressive disorder, recurrent, in partial remission: Secondary | ICD-10-CM

## 2015-09-10 NOTE — Patient Instructions (Addendum)
Call schedule mammogram on your own.  Stop at lab on way put to pick up stool IFOB test.  Keep working on healthy eating and regular exercise.

## 2015-09-10 NOTE — Assessment & Plan Note (Signed)
Stable control. 

## 2015-09-10 NOTE — Progress Notes (Signed)
Pre visit review using our clinic review tool, if applicable. No additional management support is needed unless otherwise documented below in the visit note. 

## 2015-09-10 NOTE — Assessment & Plan Note (Signed)
Well controlled. Continue current medication.  

## 2015-09-10 NOTE — Assessment & Plan Note (Signed)
Now at goal on no med. Encouraged exercise, weight loss, healthy eating habits.
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
## 2015-09-10 NOTE — Assessment & Plan Note (Signed)
Stable control. Limiting tramadol use to several times a month.

## 2015-09-10 NOTE — Assessment & Plan Note (Signed)
Stable control on effexor XR daily.

## 2015-09-17 ENCOUNTER — Other Ambulatory Visit: Payer: Self-pay | Admitting: Family Medicine

## 2015-09-17 NOTE — Telephone Encounter (Signed)
Pt is requesting a refill. Last filled on 07/20/15 #60 + 1, last OV 09/10/2015. Ok to refill?

## 2015-09-25 ENCOUNTER — Other Ambulatory Visit (INDEPENDENT_AMBULATORY_CARE_PROVIDER_SITE_OTHER): Payer: BC Managed Care – PPO

## 2015-09-25 DIAGNOSIS — Z1211 Encounter for screening for malignant neoplasm of colon: Secondary | ICD-10-CM

## 2015-09-25 LAB — FECAL OCCULT BLOOD, IMMUNOCHEMICAL: FECAL OCCULT BLD: NEGATIVE

## 2015-09-27 ENCOUNTER — Encounter: Payer: Self-pay | Admitting: *Deleted

## 2015-10-18 ENCOUNTER — Other Ambulatory Visit: Payer: Self-pay | Admitting: Family Medicine

## 2015-10-19 NOTE — Telephone Encounter (Signed)
Last office vist 09/10/2015.  Last refilled 09/17/2015 for #60 with no refills.  Ok to refill?

## 2015-10-23 ENCOUNTER — Other Ambulatory Visit: Payer: Self-pay | Admitting: Family Medicine

## 2015-11-12 ENCOUNTER — Other Ambulatory Visit: Payer: Self-pay | Admitting: Family Medicine

## 2015-11-18 ENCOUNTER — Other Ambulatory Visit: Payer: Self-pay | Admitting: Family Medicine

## 2015-11-19 NOTE — Telephone Encounter (Signed)
Last office visit 09/19/2015.  Last refilled 10/20/2015 for #60 with no refills.

## 2015-12-14 ENCOUNTER — Other Ambulatory Visit: Payer: Self-pay | Admitting: Family Medicine

## 2015-12-15 NOTE — Telephone Encounter (Signed)
Last office visit 09/10/15.  Last refilled 11/19/15 for #60 with no refills.  Ok to refill?

## 2015-12-18 ENCOUNTER — Other Ambulatory Visit: Payer: Self-pay | Admitting: Family Medicine

## 2016-01-03 ENCOUNTER — Other Ambulatory Visit: Payer: Self-pay | Admitting: Family Medicine

## 2016-01-05 NOTE — Telephone Encounter (Signed)
Last office visit 09/10/15.  Last refilled 06/3015 for #90 with no refills.  Ok to refill?

## 2016-01-06 NOTE — Telephone Encounter (Signed)
[id number] - [place], [place] - [address] Phone: [phone] 

## 2016-02-11 ENCOUNTER — Other Ambulatory Visit: Payer: Self-pay | Admitting: Family Medicine

## 2016-02-11 NOTE — Telephone Encounter (Signed)
Last office visit 09/10/2015.  Last refilled 12/17/2015 for #60 with no refills.  Ok to refill?

## 2016-03-09 ENCOUNTER — Other Ambulatory Visit: Payer: Self-pay | Admitting: Family Medicine

## 2016-03-09 NOTE — Telephone Encounter (Signed)
Last office visit 09/10/2015.  Last refilled 02/11/2016 for #60 with no refills.  Ok to refill?

## 2016-04-07 ENCOUNTER — Other Ambulatory Visit: Payer: Self-pay | Admitting: Family Medicine

## 2016-04-07 NOTE — Telephone Encounter (Signed)
Last office visit 09/10/2015.  Last refilled 03/09/2016 for #60 with no refills.  Ok to refill?

## 2016-04-13 ENCOUNTER — Other Ambulatory Visit: Payer: Self-pay | Admitting: Family Medicine

## 2016-05-01 ENCOUNTER — Other Ambulatory Visit: Payer: Self-pay | Admitting: Family Medicine

## 2016-05-02 NOTE — Telephone Encounter (Signed)
Last office visit 09/10/15.  Last refilled 01/05/16 for #90 with no refills. Ok to refill?

## 2016-05-04 NOTE — Telephone Encounter (Signed)
[id number] - [place],  - [address] Phone: [phone] 

## 2016-05-05 ENCOUNTER — Other Ambulatory Visit: Payer: Self-pay | Admitting: Family Medicine

## 2016-05-06 ENCOUNTER — Other Ambulatory Visit: Payer: Self-pay | Admitting: Family Medicine

## 2016-05-06 NOTE — Telephone Encounter (Signed)
Last office visit 09/10/2015.  Last refilled 04/07/2016 for #60 with no refills.  Ok to refill?

## 2016-06-03 ENCOUNTER — Other Ambulatory Visit: Payer: Self-pay | Admitting: Family Medicine

## 2016-06-03 NOTE — Telephone Encounter (Signed)
Last office visit 09/10/2015.  Last refilled 05/06/2016 for #60 with no refills.  Ok to refill?

## 2016-07-05 ENCOUNTER — Other Ambulatory Visit: Payer: Self-pay | Admitting: Family Medicine

## 2016-07-05 NOTE — Telephone Encounter (Signed)
Last office visit 09/10/15.  Last refilled 06/04/16 for #60 with no refills.  Ok to refill?

## 2016-08-04 ENCOUNTER — Other Ambulatory Visit: Payer: Self-pay | Admitting: Family Medicine

## 2016-08-04 NOTE — Telephone Encounter (Signed)
Last Rx 07/06/2016. Last OV 09/2015

## 2016-08-31 ENCOUNTER — Other Ambulatory Visit: Payer: Self-pay | Admitting: Family Medicine

## 2016-08-31 NOTE — Telephone Encounter (Signed)
Last office visit 09/10/2015.  Last refilled 08/04/2016 for #60 with no refills.  Ok to refill?

## 2016-09-27 ENCOUNTER — Other Ambulatory Visit: Payer: Self-pay | Admitting: Family Medicine

## 2016-09-28 NOTE — Telephone Encounter (Signed)
Last office visit 09/10/2015.  Last refilled 08/31/2016 for #60 with no refills. No future appointments scheduled.  Refill?

## 2016-10-07 ENCOUNTER — Other Ambulatory Visit: Payer: Self-pay | Admitting: Family Medicine

## 2016-10-07 NOTE — Telephone Encounter (Signed)
Letter mailed to patient to call office and schedule CPE with Dr. Diona Browner.

## 2016-10-22 ENCOUNTER — Other Ambulatory Visit: Payer: Self-pay | Admitting: Family Medicine

## 2016-10-22 DIAGNOSIS — Z1231 Encounter for screening mammogram for malignant neoplasm of breast: Secondary | ICD-10-CM

## 2016-10-26 ENCOUNTER — Other Ambulatory Visit: Payer: Self-pay | Admitting: Family Medicine

## 2016-10-26 NOTE — Telephone Encounter (Signed)
Last office visit 09/10/2015.  CPE schedule 11/13/2016.  Last refilled 09/29/2016 for #60 with no refills.  Ok to refill?

## 2016-10-29 ENCOUNTER — Other Ambulatory Visit: Payer: Self-pay | Admitting: Family Medicine

## 2016-10-31 ENCOUNTER — Other Ambulatory Visit: Payer: Self-pay | Admitting: Family Medicine

## 2016-11-02 NOTE — Telephone Encounter (Signed)
Last office visit 09/10/2015.  Last refilled 05/03/2016 for #90 with no refills.  CPE 11/13/2016.  Ok to refill?

## 2016-11-03 ENCOUNTER — Telehealth: Payer: Self-pay | Admitting: *Deleted

## 2016-11-03 NOTE — Telephone Encounter (Signed)
[id number] [place], [place] Phone: [phone]

## 2016-11-03 NOTE — Telephone Encounter (Signed)
[facility].  PA completed on CoverMyMeds.  Sent for review.  Can take up to 72 hours for a decision. 

## 2016-11-04 NOTE — Telephone Encounter (Signed)
PA for Tramadol approved from 11/03/2016 through 11/03/2017.  [facility] notified of approval via fax.

## 2016-11-06 ENCOUNTER — Other Ambulatory Visit (INDEPENDENT_AMBULATORY_CARE_PROVIDER_SITE_OTHER): Payer: BC Managed Care – PPO

## 2016-11-06 ENCOUNTER — Telehealth: Payer: Self-pay | Admitting: Family Medicine

## 2016-11-06 ENCOUNTER — Ambulatory Visit
Admission: RE | Admit: 2016-11-06 | Discharge: 2016-11-06 | Disposition: A | Discharge status: Home / Self Care | Payer: BC Managed Care – PPO | Source: Ambulatory Visit | Attending: Family Medicine | Admitting: Family Medicine

## 2016-11-06 DIAGNOSIS — R7303 Prediabetes: Secondary | ICD-10-CM | POA: Diagnosis not present

## 2016-11-06 DIAGNOSIS — Z1231 Encounter for screening mammogram for malignant neoplasm of breast: Secondary | ICD-10-CM | POA: Diagnosis not present

## 2016-11-06 DIAGNOSIS — E7800 Pure hypercholesterolemia, unspecified: Secondary | ICD-10-CM

## 2016-11-06 LAB — LIPID PANEL
CHOLESTEROL: 215 mg/dL — AB (ref 0–200)
HDL: 52 mg/dL (ref >39.00)
LDL CALC: 138 mg/dL — AB (ref 0–99)
NonHDL: 163.23
TRIGLYCERIDES: 127 mg/dL (ref 0.0–149.0)
Total CHOL/HDL Ratio: 4
VLDL: 25.4 mg/dL (ref 0.0–40.0)

## 2016-11-06 LAB — COMPREHENSIVE METABOLIC PANEL
ALK PHOS: 90 U/L (ref 39–117)
ALT: 42 U/L — AB (ref 0–35)
AST: 27 U/L (ref 0–37)
Albumin: 4.1 g/dL (ref 3.5–5.2)
BUN: 15 mg/dL (ref 6–23)
CO2: 30 meq/L (ref 19–32)
Calcium: 9.6 mg/dL (ref 8.4–10.5)
Chloride: 100 mEq/L (ref 96–112)
Creatinine, Ser: 0.84 mg/dL (ref 0.40–1.20)
GFR: 73.29 mL/min (ref >60.00)
GLUCOSE: 105 mg/dL — AB (ref 70–99)
POTASSIUM: 4.1 meq/L (ref 3.5–5.1)
SODIUM: 137 meq/L (ref 135–145)
TOTAL PROTEIN: 6.6 g/dL (ref 6.0–8.3)
Total Bilirubin: 0.6 mg/dL (ref 0.2–1.2)

## 2016-11-06 LAB — HEMOGLOBIN A1C: Hgb A1c MFr Bld: 5.6 % (ref 4.6–6.5)

## 2016-11-06 NOTE — Telephone Encounter (Signed)
-----   Message from Ellamae Sia sent at 10/26/2016  2:44 PM EDT ----- Regarding: Lab orders for Friday, 9.28.18 Patient is scheduled for CPX labs, please order future labs, Thanks , Karna Christmas

## 2016-11-13 ENCOUNTER — Encounter: Payer: Self-pay | Admitting: Family Medicine

## 2016-11-13 ENCOUNTER — Other Ambulatory Visit (HOSPITAL_COMMUNITY)
Admission: RE | Admit: 2016-11-13 | Discharge: 2016-11-13 | Disposition: A | Discharge status: Home / Self Care | Payer: BC Managed Care – PPO | Source: Ambulatory Visit | Attending: Family Medicine | Admitting: Family Medicine

## 2016-11-13 ENCOUNTER — Ambulatory Visit (INDEPENDENT_AMBULATORY_CARE_PROVIDER_SITE_OTHER): Payer: BC Managed Care – PPO | Admitting: Family Medicine

## 2016-11-13 VITALS — BP 114/80 | HR 76 | Temp 98.5°F | Ht 61.0 in | Wt 145.0 lb

## 2016-11-13 DIAGNOSIS — Z124 Encounter for screening for malignant neoplasm of cervix: Secondary | ICD-10-CM | POA: Insufficient documentation

## 2016-11-13 DIAGNOSIS — Z0000 Encounter for general adult medical examination without abnormal findings: Secondary | ICD-10-CM

## 2016-11-13 DIAGNOSIS — M797 Fibromyalgia: Secondary | ICD-10-CM | POA: Diagnosis not present

## 2016-11-13 DIAGNOSIS — F3341 Major depressive disorder, recurrent, in partial remission: Secondary | ICD-10-CM | POA: Diagnosis not present

## 2016-11-13 DIAGNOSIS — I10 Essential (primary) hypertension: Secondary | ICD-10-CM

## 2016-11-13 DIAGNOSIS — E7800 Pure hypercholesterolemia, unspecified: Secondary | ICD-10-CM

## 2016-11-13 DIAGNOSIS — R7303 Prediabetes: Secondary | ICD-10-CM

## 2016-11-13 DIAGNOSIS — Z1211 Encounter for screening for malignant neoplasm of colon: Secondary | ICD-10-CM

## 2016-11-13 NOTE — Patient Instructions (Addendum)
Increase exercise as able, low cholesterol low carb  Diet.  Stop at lab for stool test.

## 2016-11-13 NOTE — Assessment & Plan Note (Signed)
Stable control on tramadol and cyclobenzaprine prn.

## 2016-11-13 NOTE — Assessment & Plan Note (Signed)
Well controlled. Continue current medication.  

## 2016-11-13 NOTE — Assessment & Plan Note (Signed)
Work on [employer], info reviewed.

## 2016-11-13 NOTE — Assessment & Plan Note (Signed)
Encouraged exercise, weight loss, healthy eating habits. ? ?

## 2016-11-13 NOTE — Assessment & Plan Note (Signed)
Stable , tolearable but not ideal control on venlafaxine. Pt does not wish to change/adjust meds.

## 2016-11-13 NOTE — Progress Notes (Signed)
Subjective:    Patient ID: Katie Dennis, female    DOB: February 01, 1956, 61 y.o.   MRN: [id number]  HPI  The patient is here for annual wellness exam and preventative care.    Depression, major recurrent: Stable, not ideal on venlafaxine 75 mg daily. She does not wish to change meds. PHQ9 12  Hypertension:  Good control on losartan HCTZ.   Using medication without problems or lightheadedness: none Chest pain with exertion: none Edema:none Short of breath:none Average home BPs:120/70s Other issues:  Prediabetes:  Lab Results  Component Value Date   HGBA1C 5.6 11/06/2016     Elevated Cholesterol:  Inadequate control with diet. Will get back on track. Lab Results  Component Value Date   CHOL 215 (H) 11/06/2016   HDL 52.00 11/06/2016   LDLCALC 138 (H) 11/06/2016   LDLDIRECT 153.4 11/15/2012   TRIG 127.0 11/06/2016   CHOLHDL 4 11/06/2016  Using medications without problems: Muscle aches:  Diet compliance: moderate Exercise: minimal Other complaints:  Slight elevation in ALT: no ETOH, no liver issues in family. Occ tylenol.  Fibromyalgia: Pain controlled with tramadol and cyclobenzaprine prn. Fatigue tolerable.  Social History /Family History/Past Medical History reviewed in detail and updated in EMR if needed. Blood pressure 114/80, pulse 76, temperature 98.5 F (36.9 C), temperature source Oral, height 5\' 1"  (1.549 m), weight 145 lb (65.8 kg).  Review of Systems  Constitutional: Negative for fatigue and fever.  HENT: Negative for congestion.   Eyes: Negative for pain.  Respiratory: Negative for cough and shortness of breath.   Cardiovascular: Negative for chest pain, palpitations and leg swelling.  Gastrointestinal: Negative for abdominal pain.  Genitourinary: Negative for dysuria and vaginal bleeding.  Musculoskeletal: Negative for back pain.  Neurological: Negative for syncope, light-headedness and headaches.  Psychiatric/Behavioral: Negative for dysphoric mood.         Objective:   Physical Exam  Constitutional: Vital signs are normal. She appears well-developed and well-nourished. She is cooperative.  Non-toxic appearance. She does not appear ill. No distress.  HENT:  Head: Normocephalic.  Right Ear: Hearing, tympanic membrane, external ear and ear canal normal.  Left Ear: Hearing, tympanic membrane, external ear and ear canal normal.  Nose: Nose normal.  Eyes: Pupils are equal, round, and reactive to light. Conjunctivae, EOM and lids are normal. Lids are everted and swept, no foreign bodies found.  Neck: Trachea normal and normal range of motion. Neck supple. Carotid bruit is not present. No thyroid mass and no thyromegaly present.  Cardiovascular: Normal rate, regular rhythm, S1 normal, S2 normal, normal heart sounds and intact distal pulses.  Exam reveals no gallop.   No murmur heard. Pulmonary/Chest: Effort normal and breath sounds normal. No respiratory distress. She has no wheezes. She has no rhonchi. She has no rales.  Abdominal: Soft. Normal appearance and bowel sounds are normal. She exhibits no distension, no fluid wave, no abdominal bruit and no mass. There is no hepatosplenomegaly. There is no tenderness. There is no rebound, no guarding and no CVA tenderness. No hernia.  Genitourinary: Vagina normal and uterus normal. No breast swelling, tenderness, discharge or bleeding. Pelvic exam was performed with patient supine. There is no rash, tenderness or lesion on the right labia. There is no rash, tenderness or lesion on the left labia. Uterus is not enlarged and not tender. Cervix exhibits no motion tenderness, no discharge and no friability. Right adnexum displays no mass, no tenderness and no fullness. Left adnexum displays no mass, no tenderness  and no fullness.  Lymphadenopathy:    She has no cervical adenopathy.    She has no axillary adenopathy.  Neurological: She is alert. She has normal strength. No cranial nerve deficit or sensory  deficit.  Skin: Skin is warm, dry and intact. No rash noted.  Healing derm biopsy site with granulation tissue on left arm.  Psychiatric: Her speech is normal and behavior is normal. Judgment normal. Her mood appears not anxious. Cognition and memory are normal. She does not exhibit a depressed mood.          Assessment & Plan:  The patient's preventative maintenance and recommended screening tests for an annual wellness exam were reviewed in full today. Brought up to date unless services declined.  Counselled on the importance of diet, exercise, and its role in overall health and mortality. The patient's FH and SH was reviewed, including their home life, tobacco status, and drug and alcohol status.   Last pap 2015, on q3 year schedule, DUE this year  Colon cancer screening:  ifob neg 2017.Marland Kitchen Repeat due  mammogram 11/06/2016 Vaccines: uptodate with Tdap , refused flu today.. [facility]. DEXA: due at 60-65, low risk. Former smoker 20 pack year history, QUIT remotely 1996, Hep C: done  HIV: refused

## 2016-11-14 ENCOUNTER — Encounter: Payer: Self-pay | Admitting: Family Medicine

## 2016-11-17 LAB — CYTOLOGY - PAP
DIAGNOSIS: NEGATIVE
HPV (WINDOPATH): NOT DETECTED

## 2016-11-24 ENCOUNTER — Other Ambulatory Visit (INDEPENDENT_AMBULATORY_CARE_PROVIDER_SITE_OTHER): Payer: BC Managed Care – PPO

## 2016-11-24 DIAGNOSIS — Z1211 Encounter for screening for malignant neoplasm of colon: Secondary | ICD-10-CM | POA: Diagnosis not present

## 2016-11-24 LAB — FECAL OCCULT BLOOD, IMMUNOCHEMICAL: Fecal Occult Bld: NEGATIVE

## 2016-11-25 ENCOUNTER — Other Ambulatory Visit: Payer: Self-pay | Admitting: Family Medicine

## 2016-11-25 NOTE — Telephone Encounter (Signed)
Last office visit 11/13/2016.  Last refilled 10/27/2016 for #60 with no refills.  Ok to refill?

## 2016-12-03 ENCOUNTER — Encounter: Payer: Self-pay | Admitting: Family Medicine

## 2016-12-25 ENCOUNTER — Other Ambulatory Visit: Payer: Self-pay | Admitting: Family Medicine

## 2016-12-25 NOTE — Telephone Encounter (Signed)
Last office visit 11/13/2016.  Last refilled 11/26/2016 for #60 with no refills.  Ok to refill?

## 2017-01-03 ENCOUNTER — Other Ambulatory Visit: Payer: Self-pay | Admitting: Family Medicine

## 2017-02-12 ENCOUNTER — Other Ambulatory Visit: Payer: Self-pay | Admitting: Family Medicine

## 2017-02-12 NOTE — Telephone Encounter (Signed)
Last office visit 11/13/2016.  Last refilled 12/25/2016 for #60 with no refills.  Ok to refill?

## 2017-02-14 ENCOUNTER — Other Ambulatory Visit: Payer: Self-pay | Admitting: Family Medicine

## 2017-03-14 ENCOUNTER — Other Ambulatory Visit: Payer: Self-pay | Admitting: Family Medicine

## 2017-03-14 NOTE — Telephone Encounter (Signed)
Last office visit 11/13/16.  Last refilled 02/12/17 for #60 with no refills.  Ok to refill?

## 2017-03-22 ENCOUNTER — Other Ambulatory Visit: Payer: Self-pay | Admitting: Family Medicine

## 2017-03-22 NOTE — Telephone Encounter (Signed)
Last office visit 11/13/2016.  Last refilled 11/03/2016 for #90 with no refills.  Ok to refill?

## 2017-04-13 ENCOUNTER — Other Ambulatory Visit: Payer: Self-pay | Admitting: Family Medicine

## 2017-04-13 NOTE — Telephone Encounter (Signed)
Last office visit 11/13/2016.  Last refilled 03/15/2017 for #60 with no refills.  Ok to refill?

## 2017-05-15 ENCOUNTER — Encounter: Payer: Self-pay | Admitting: Family Medicine

## 2017-05-23 ENCOUNTER — Other Ambulatory Visit: Payer: Self-pay | Admitting: Family Medicine

## 2017-05-24 NOTE — Telephone Encounter (Signed)
Last office visit 11/13/2016.  Last refilled 04/14/2017 for #60 with no refills.  Ok to refill?

## 2017-05-27 ENCOUNTER — Other Ambulatory Visit: Payer: Self-pay | Admitting: Family Medicine

## 2017-05-27 ENCOUNTER — Encounter: Payer: Self-pay | Admitting: Family Medicine

## 2017-05-28 NOTE — Telephone Encounter (Signed)
LOV  11/13/16 Dr. Diona Browner

## 2017-05-28 NOTE — Telephone Encounter (Signed)
The wrong prescription was called in.  Should Be  DULoxetine (CYMBALTA) 60 MG capsule [Pharmacy Med Name: DULOXETINE HCL DR 60 MG CAP]  Pt. Stated that Dr. Diona Browner was going to change the  cyclobenzaprine (FLEXERIL) 10 MG tablet to Cymbalta  Pt. Is almost out.  Please send in the correct Duloxitine prescription

## 2017-05-31 NOTE — Telephone Encounter (Signed)
Per MyChart message on 05/15/2017,  Dr Diona Browner was suppose to send in Rx for Cymbalta.  Refill sent today per that message.

## 2017-06-01 ENCOUNTER — Encounter: Payer: Self-pay | Admitting: Family Medicine

## 2017-06-18 ENCOUNTER — Encounter: Payer: Self-pay | Admitting: Family Medicine

## 2017-06-18 ENCOUNTER — Ambulatory Visit: Payer: BC Managed Care – PPO | Admitting: Family Medicine

## 2017-06-18 ENCOUNTER — Other Ambulatory Visit: Payer: Self-pay

## 2017-06-18 VITALS — BP 116/62 | HR 62 | Temp 98.8°F | Ht 61.0 in | Wt 149.0 lb

## 2017-06-18 DIAGNOSIS — F3341 Major depressive disorder, recurrent, in partial remission: Secondary | ICD-10-CM | POA: Diagnosis not present

## 2017-06-18 NOTE — Assessment & Plan Note (Signed)
Excellent improvement in control on cymbalta. Minimal SE.  Continue current dose.

## 2017-06-18 NOTE — Progress Notes (Signed)
   Subjective:    Patient ID: Katie Dennis, female    DOB: 1956/01/25, 62 y.o.   MRN: [id number]  HPI   62 year old female patient presents for 4 week follow up on MDD, recurrent and fibromyalgia.   She is currently on cymbalta 60 mg. ( She had been on this in past but thought GERD and BP elevation caused by it)  Changed to this in 05/15/2017 given venlafaxine was not helping.  She reports minimal side effects. She does have some reflux.. Using magnesium which has improved symptoms a lot.  95% improvement in mood.  No anhedonia, well controlled depression. Still some overeating and decreased energy but minimally problematically.  Sleeping well at night.    Blood pressure 116/62, pulse 62, temperature 98.8 F (37.1 C), temperature source Oral, height 5\' 1"  (1.549 m), weight 149 lb (67.6 kg).  Review of Systems  Constitutional: Negative for fatigue and fever.  HENT: Negative for ear pain.   Eyes: Negative for pain.  Respiratory: Negative for chest tightness and shortness of breath.   Cardiovascular: Negative for chest pain, palpitations and leg swelling.  Gastrointestinal: Negative for abdominal pain.  Genitourinary: Negative for dysuria.       Objective:   Physical Exam  Constitutional: Vital signs are normal. She appears well-developed and well-nourished. She is cooperative.  Non-toxic appearance. She does not appear ill. No distress.  HENT:  Head: Normocephalic.  Right Ear: Hearing, tympanic membrane, external ear and ear canal normal. Tympanic membrane is not erythematous, not retracted and not bulging.  Left Ear: Hearing, tympanic membrane, external ear and ear canal normal. Tympanic membrane is not erythematous, not retracted and not bulging.  Nose: No mucosal edema or rhinorrhea. Right sinus exhibits no maxillary sinus tenderness and no frontal sinus tenderness. Left sinus exhibits no maxillary sinus tenderness and no frontal sinus tenderness.  Mouth/Throat: Uvula is midline,  oropharynx is clear and moist and mucous membranes are normal.  Eyes: Pupils are equal, round, and reactive to light. Conjunctivae, EOM and lids are normal. Lids are everted and swept, no foreign bodies found.  Neck: Trachea normal and normal range of motion. Neck supple. Carotid bruit is not present. No thyroid mass and no thyromegaly present.  Cardiovascular: Normal rate, regular rhythm, S1 normal, S2 normal, normal heart sounds, intact distal pulses and normal pulses. Exam reveals no gallop and no friction rub.  No murmur heard. Pulmonary/Chest: Effort normal and breath sounds normal. No tachypnea. No respiratory distress. She has no decreased breath sounds. She has no wheezes. She has no rhonchi. She has no rales.  Abdominal: Soft. Normal appearance and bowel sounds are normal. There is no tenderness.  Neurological: She is alert.  Skin: Skin is warm, dry and intact. No rash noted.  Psychiatric: Her speech is normal and behavior is normal. Judgment and thought content normal. Her mood appears not anxious. Cognition and memory are normal. She does not exhibit a depressed mood.          Assessment & Plan:

## 2017-06-18 NOTE — Patient Instructions (Signed)
Continue Cymbalta at current dose.  Follow up as needed.

## 2017-06-23 ENCOUNTER — Other Ambulatory Visit: Payer: Self-pay | Admitting: Family Medicine

## 2017-06-23 NOTE — Telephone Encounter (Signed)
Last office visit 06/18/2017.  Last refilled 05/25/2017 for #60 with no refills.  Ok to refill?

## 2017-06-25 ENCOUNTER — Other Ambulatory Visit: Payer: Self-pay | Admitting: Family Medicine

## 2017-07-28 ENCOUNTER — Other Ambulatory Visit: Payer: Self-pay | Admitting: Family Medicine

## 2017-07-29 ENCOUNTER — Other Ambulatory Visit: Payer: Self-pay | Admitting: Family Medicine

## 2017-07-29 NOTE — Telephone Encounter (Signed)
Last office visit 06/18/2017.  Last refilled 06/24/2017 for #60 with no refills.  Ok to refill?

## 2017-07-30 NOTE — Telephone Encounter (Signed)
Last office visit 06/18/2017.  Last refilled 03/23/2017 for #90 with no refills.  Ok to refill?

## 2017-08-22 ENCOUNTER — Other Ambulatory Visit: Payer: Self-pay | Admitting: Family Medicine

## 2017-08-23 NOTE — Telephone Encounter (Signed)
Electronic refill request Last refill 05/31/17 #90 Last office visit 06/18/17 See drug warning with Tramadol

## 2017-08-27 ENCOUNTER — Other Ambulatory Visit: Payer: Self-pay | Admitting: Family Medicine

## 2017-08-27 NOTE — Telephone Encounter (Signed)
Electronic refill request Last office visit 06/18/17 Last refill 07/29/17 #60 Upcoming appointment 12/23/17

## 2017-09-26 ENCOUNTER — Other Ambulatory Visit: Payer: Self-pay | Admitting: Family Medicine

## 2017-09-27 NOTE — Telephone Encounter (Signed)
Last office visit 06/18/2017 for depression.  Last refilled 08/27/2017 for #60 with no refills.  CPE scheduled for 12/23/2017.  Ok to refill?

## 2017-10-27 ENCOUNTER — Other Ambulatory Visit: Payer: Self-pay | Admitting: Family Medicine

## 2017-10-27 NOTE — Telephone Encounter (Signed)
Last office visit 06/18/2017 for depression.  Last refilled 09/28/2017 for#60 with no refills.  Ok to refill?

## 2017-11-24 ENCOUNTER — Other Ambulatory Visit: Payer: Self-pay | Admitting: Family Medicine

## 2017-11-24 NOTE — Telephone Encounter (Signed)
Last office visit 06/18/2017 for Depression. CPE scheduled 12/23/2017.   Last refilled 10/28/2017 for #60 with no refills.  Ok to refill?

## 2017-11-30 ENCOUNTER — Other Ambulatory Visit: Payer: Self-pay | Admitting: Family Medicine

## 2017-12-01 ENCOUNTER — Telehealth: Payer: Self-pay | Admitting: *Deleted

## 2017-12-01 NOTE — Telephone Encounter (Signed)
Last office visit 06/18/2017 for depression. CPE 12/23/2017.  Last refilled 07/30/2017 for #90 with no refills.

## 2017-12-01 NOTE — Telephone Encounter (Signed)
[facility] for Tramadol.  PA completed on CoverMyMeds.  Sent for review.  Can take up to 72 hours for a decision.

## 2017-12-02 NOTE — Telephone Encounter (Signed)
PA approved from 12/01/2017 to 12/02/2018.  [facility] notified of approval via fax.

## 2017-12-16 ENCOUNTER — Other Ambulatory Visit (INDEPENDENT_AMBULATORY_CARE_PROVIDER_SITE_OTHER): Payer: BC Managed Care – PPO

## 2017-12-16 ENCOUNTER — Telehealth: Payer: Self-pay | Admitting: Family Medicine

## 2017-12-16 DIAGNOSIS — R7303 Prediabetes: Secondary | ICD-10-CM

## 2017-12-16 DIAGNOSIS — E7800 Pure hypercholesterolemia, unspecified: Secondary | ICD-10-CM | POA: Diagnosis not present

## 2017-12-16 LAB — COMPREHENSIVE METABOLIC PANEL WITH GFR
ALT: 34 U/L (ref 0–35)
AST: 27 U/L (ref 0–37)
Albumin: 4.1 g/dL (ref 3.5–5.2)
Alkaline Phosphatase: 94 U/L (ref 39–117)
BUN: 11 mg/dL (ref 6–23)
CO2: 33 meq/L — ABNORMAL HIGH (ref 19–32)
Calcium: 9.1 mg/dL (ref 8.4–10.5)
Chloride: 99 meq/L (ref 96–112)
Creatinine, Ser: 0.9 mg/dL (ref 0.40–1.20)
GFR: 67.43 mL/min
Glucose, Bld: 103 mg/dL — ABNORMAL HIGH (ref 70–99)
Potassium: 3.6 meq/L (ref 3.5–5.1)
Sodium: 138 meq/L (ref 135–145)
Total Bilirubin: 0.4 mg/dL (ref 0.2–1.2)
Total Protein: 6.8 g/dL (ref 6.0–8.3)

## 2017-12-16 LAB — LIPID PANEL
Cholesterol: 213 mg/dL — ABNORMAL HIGH (ref 0–200)
HDL: 52 mg/dL
LDL Cholesterol: 128 mg/dL — ABNORMAL HIGH (ref 0–99)
NonHDL: 161
Total CHOL/HDL Ratio: 4
Triglycerides: 167 mg/dL — ABNORMAL HIGH (ref 0.0–149.0)
VLDL: 33.4 mg/dL (ref 0.0–40.0)

## 2017-12-16 LAB — HEMOGLOBIN A1C: HEMOGLOBIN A1C: 6.1 % (ref 4.6–6.5)

## 2017-12-16 NOTE — Telephone Encounter (Signed)
-----   Message from Ellamae Sia sent at 12/07/2017  9:00 AM EDT ----- Regarding: Lab orders for Thursday, 11.7.19 Patient is scheduled for CPX labs, please order future labs, Thanks , Karna Christmas

## 2017-12-16 NOTE — Addendum Note (Signed)
Addended by: Carter Kitten on: 12/16/2017 09:15 AM   Modules accepted: Orders

## 2017-12-23 ENCOUNTER — Encounter: Payer: Self-pay | Admitting: Family Medicine

## 2017-12-23 ENCOUNTER — Ambulatory Visit (INDEPENDENT_AMBULATORY_CARE_PROVIDER_SITE_OTHER): Payer: BC Managed Care – PPO | Admitting: Family Medicine

## 2017-12-23 VITALS — BP 104/70 | HR 88 | Temp 98.6°F | Ht 61.0 in | Wt 151.8 lb

## 2017-12-23 DIAGNOSIS — E7800 Pure hypercholesterolemia, unspecified: Secondary | ICD-10-CM

## 2017-12-23 DIAGNOSIS — I10 Essential (primary) hypertension: Secondary | ICD-10-CM

## 2017-12-23 DIAGNOSIS — Z1231 Encounter for screening mammogram for malignant neoplasm of breast: Secondary | ICD-10-CM

## 2017-12-23 DIAGNOSIS — Z23 Encounter for immunization: Secondary | ICD-10-CM

## 2017-12-23 DIAGNOSIS — Z0000 Encounter for general adult medical examination without abnormal findings: Secondary | ICD-10-CM

## 2017-12-23 DIAGNOSIS — F3341 Major depressive disorder, recurrent, in partial remission: Secondary | ICD-10-CM

## 2017-12-23 DIAGNOSIS — R7303 Prediabetes: Secondary | ICD-10-CM | POA: Diagnosis not present

## 2017-12-23 DIAGNOSIS — E2839 Other primary ovarian failure: Secondary | ICD-10-CM

## 2017-12-23 DIAGNOSIS — M67442 Ganglion, left hand: Secondary | ICD-10-CM | POA: Insufficient documentation

## 2017-12-23 DIAGNOSIS — Z1211 Encounter for screening for malignant neoplasm of colon: Secondary | ICD-10-CM

## 2017-12-23 NOTE — Assessment & Plan Note (Signed)
Well controlled. Continue current medication.  

## 2017-12-23 NOTE — Assessment & Plan Note (Signed)
Work on [employer], info given.

## 2017-12-23 NOTE — Patient Instructions (Addendum)
Work on decreasing carbohydrates and increase exercise to 3-5 times a week.  Decrease dessert.  Please stop at the lab for stool test. Please stop at the front desk to set up referral.

## 2017-12-23 NOTE — Progress Notes (Signed)
Subjective:    Patient ID: Katie Dennis, female    DOB: [birth year], 62 y.o.   MRN: [id number]  HPI  The patient is here for annual wellness exam and preventative care.     MDD: stable control on cymbalta.    Office Visit from 12/23/2017 in [place] [facility]  PHQ-2 Total Score  0       Hypertension:   Good control on losartan HCTZ  BP Readings from Last 3 Encounters:  12/23/17 104/70  06/18/17 116/62  11/13/16 114/80  Using medication without problems or lightheadedness: none Chest pain with exertion:none Edema:none Short of breath:none Average home BPs: Other issues:  Elevated Cholesterol:  LDL at goal with diet < 130. Lab Results  Component Value Date   CHOL 213 (H) 12/16/2017   HDL 52.00 12/16/2017   LDLCALC 128 (H) 12/16/2017   LDLDIRECT 153.4 11/15/2012   TRIG 167.0 (H) 12/16/2017   CHOLHDL 4 12/16/2017  Using medications without problems: Muscle aches:  Diet compliance: more ice cream and candy lately Exercise: minimal Other complaints:  Prediabetes  Slightly worsened control. Lab Results  Component Value Date   HGBA1C 6.1 12/16/2017    Social History /Family History/Past Medical History reviewed in detail and updated in EMR if needed. Blood pressure 104/70, pulse 88, temperature 98.6 F (37 C), temperature source Oral, height 5\' 1"  (1.549 m), weight 151 lb 12 oz (68.8 kg). Body mass index is 28.67 kg/m.   Review of Systems  Constitutional: Negative for fatigue and fever.  HENT: Negative for congestion.   Eyes: Negative for pain.  Respiratory: Negative for cough and shortness of breath.   Cardiovascular: Negative for chest pain, palpitations and leg swelling.  Gastrointestinal: Negative for abdominal pain.  Genitourinary: Negative for dysuria and vaginal bleeding.  Musculoskeletal: Negative for back pain.  Neurological: Negative for syncope, light-headedness and headaches.  Psychiatric/Behavioral: Negative for dysphoric mood.        Objective:   Physical Exam  Constitutional: Vital signs are normal. She appears well-developed and well-nourished. She is cooperative.  Non-toxic appearance. She does not appear ill. No distress.  HENT:  Head: Normocephalic.  Right Ear: Hearing, tympanic membrane, external ear and ear canal normal.  Left Ear: Hearing, tympanic membrane, external ear and ear canal normal.  Nose: Nose normal.  Eyes: Pupils are equal, round, and reactive to light. Conjunctivae, EOM and lids are normal. Lids are everted and swept, no foreign bodies found.  Neck: Trachea normal and normal range of motion. Neck supple. Carotid bruit is not present. No thyroid mass and no thyromegaly present.  Cardiovascular: Normal rate, regular rhythm, S1 normal, S2 normal, normal heart sounds and intact distal pulses. Exam reveals no gallop.  No murmur heard. Pulmonary/Chest: Effort normal and breath sounds normal. No respiratory distress. She has no wheezes. She has no rhonchi. She has no rales.  Abdominal: Soft. Normal appearance and bowel sounds are normal. She exhibits no distension, no fluid wave, no abdominal bruit and no mass. There is no hepatosplenomegaly. There is no tenderness. There is no rebound, no guarding and no CVA tenderness. No hernia.  Lymphadenopathy:    She has no cervical adenopathy.    She has no axillary adenopathy.  Neurological: She is alert. She has normal strength. No cranial nerve deficit or sensory deficit.  Skin: Skin is warm, dry and intact. No rash noted.  Psychiatric: Her speech is normal and behavior is normal. Judgment normal. Her mood appears not anxious. Cognition and memory  are normal. She does not exhibit a depressed mood.    Left 5th digit PIP joint with  Clear fluid cyst.. Non painful, no associated redness.. Likely mucocele.     Assessment & Plan:  The patient's preventative maintenance and recommended screening tests for an annual wellness exam were reviewed in full  today. Brought up to date unless services declined.  Counselled on the importance of diet, exercise, and its role in overall health and mortality. The patient's FH and SH was reviewed, including their home life, tobacco status, and drug and alcohol status.   Last pap 2018, on q3 year schedule. Plan last at age 78.  no family history of ovarian  uterine cancer. Colon cancer screening:  ifob neg 2018.Marland Kitchen Repeat due  mammogram 11/06/2016 Vaccines: uptodate with Tdap , given flu shot today. DEXA: due at 60-65, low risk. Former smoker 20 pack year history, QUIT remotely 1996, Hep C: done HIV: refused

## 2017-12-23 NOTE — Assessment & Plan Note (Signed)
Follow.. If symptomatic refer to hand specialist.

## 2017-12-24 ENCOUNTER — Telehealth: Payer: Self-pay | Admitting: Family Medicine

## 2017-12-24 ENCOUNTER — Other Ambulatory Visit: Payer: Self-pay | Admitting: Family Medicine

## 2017-12-24 NOTE — Telephone Encounter (Signed)
Left message asking pt to call office regarding appointment

## 2017-12-24 NOTE — Telephone Encounter (Signed)
Last filled 11-24-17 #60 Last OV/CPE 12-23-17 No Future OV [facility] [address]

## 2017-12-30 ENCOUNTER — Other Ambulatory Visit (INDEPENDENT_AMBULATORY_CARE_PROVIDER_SITE_OTHER): Payer: BC Managed Care – PPO

## 2017-12-30 ENCOUNTER — Encounter: Payer: Self-pay | Admitting: Family Medicine

## 2017-12-30 DIAGNOSIS — Z1211 Encounter for screening for malignant neoplasm of colon: Secondary | ICD-10-CM

## 2017-12-30 LAB — FECAL OCCULT BLOOD, IMMUNOCHEMICAL: Fecal Occult Bld: NEGATIVE

## 2018-01-30 ENCOUNTER — Other Ambulatory Visit: Payer: Self-pay | Admitting: Family Medicine

## 2018-02-04 ENCOUNTER — Other Ambulatory Visit: Payer: Self-pay | Admitting: Family Medicine

## 2018-02-10 ENCOUNTER — Ambulatory Visit
Admission: RE | Admit: 2018-02-10 | Discharge: 2018-02-10 | Disposition: A | Discharge status: Home / Self Care | Payer: BC Managed Care – PPO | Source: Ambulatory Visit | Attending: Family Medicine | Admitting: Family Medicine

## 2018-02-10 DIAGNOSIS — E2839 Other primary ovarian failure: Secondary | ICD-10-CM | POA: Insufficient documentation

## 2018-02-10 DIAGNOSIS — Z1231 Encounter for screening mammogram for malignant neoplasm of breast: Secondary | ICD-10-CM | POA: Diagnosis present

## 2018-02-11 ENCOUNTER — Encounter: Payer: Self-pay | Admitting: Family Medicine

## 2018-02-11 DIAGNOSIS — M8580 Other specified disorders of bone density and structure, unspecified site: Secondary | ICD-10-CM | POA: Insufficient documentation

## 2018-02-14 MED ORDER — LOSARTAN POTASSIUM 100 MG PO TABS: 100.0000 mg | ORAL_TABLET | Freq: Every day | ORAL | 1 refills | Status: DC

## 2018-02-14 MED ORDER — HYDROCHLOROTHIAZIDE 25 MG PO TABS: 25.0000 mg | ORAL_TABLET | Freq: Every day | ORAL | 1 refills | Status: DC

## 2018-02-17 ENCOUNTER — Other Ambulatory Visit: Payer: Self-pay | Admitting: Family Medicine

## 2018-03-01 ENCOUNTER — Other Ambulatory Visit: Payer: Self-pay | Admitting: Family Medicine

## 2018-03-01 NOTE — Telephone Encounter (Signed)
Last office visit 12/23/2017 for CPE.  Last refilled 01/31/2018 for #60 with no refills.  No future appointments.

## 2018-03-09 ENCOUNTER — Other Ambulatory Visit: Payer: Self-pay | Admitting: Family Medicine

## 2018-03-10 NOTE — Telephone Encounter (Signed)
Last office visit 12/23/2017 for CPE.  Last refilled 12/01/2017 for #90 with no refills.  No future appointments

## 2018-04-30 ENCOUNTER — Other Ambulatory Visit: Payer: Self-pay | Admitting: Family Medicine

## 2018-05-02 NOTE — Telephone Encounter (Signed)
Last office visit 12/23/2017 CPE.  Last refilled 03/01/2018 for #60 with no refills.  No future appointments.

## 2018-06-09 ENCOUNTER — Other Ambulatory Visit: Payer: Self-pay | Admitting: Family Medicine

## 2018-06-09 NOTE — Telephone Encounter (Signed)
Call pt... given A1C in 12/2017 showed prediabetes slightly worse ... offer her curbside labs to recheck and 6 month follow up Chol/prediabetes appointment virtually... if she would like.

## 2018-06-09 NOTE — Telephone Encounter (Signed)
Last office visit 12/23/2017 for CPE.  Last refilled 03/10/2018 for #90 with no refills.  No future appointments.

## 2018-06-14 NOTE — Telephone Encounter (Signed)
Left message asking pt to call office  °

## 2018-06-27 ENCOUNTER — Other Ambulatory Visit: Payer: Self-pay | Admitting: Family Medicine

## 2018-06-27 NOTE — Telephone Encounter (Signed)
Last office visit 12/23/2017 CPE.  Last refilled 05/02/2018 for #60 with 1refill.  No future appointments.

## 2018-08-08 ENCOUNTER — Other Ambulatory Visit: Payer: Self-pay | Admitting: Family Medicine

## 2018-08-14 ENCOUNTER — Other Ambulatory Visit: Payer: Self-pay | Admitting: Family Medicine

## 2018-08-20 ENCOUNTER — Other Ambulatory Visit: Payer: Self-pay | Admitting: Family Medicine

## 2018-08-25 ENCOUNTER — Other Ambulatory Visit: Payer: Self-pay | Admitting: Family Medicine

## 2018-08-25 NOTE — Telephone Encounter (Signed)
Last filled 07-26-18 #60 Last OV 12-23-17 No Future OV   [facility]

## 2018-09-02 MED ORDER — LOSARTAN POTASSIUM 50 MG PO TABS: 100.0000 mg | ORAL_TABLET | Freq: Every day | ORAL | 11 refills | Status: DC

## 2018-09-27 ENCOUNTER — Other Ambulatory Visit: Payer: Self-pay | Admitting: Family Medicine

## 2018-09-27 NOTE — Telephone Encounter (Signed)
Last office visit 12/23/2017 for CPE.  Last refilled Tramadol 06/09/2018 for #90 with no refills.  Cyclobenzaprine 08/25/2018 for #60 with 1 refill.  No future appointments.

## 2018-12-14 ENCOUNTER — Other Ambulatory Visit: Payer: Self-pay | Admitting: Family Medicine

## 2018-12-14 NOTE — Telephone Encounter (Signed)
Last office visit 12/23/2017 for CPE.  Last refilled 09/27/2018 for #60 with 1 refill.  No future appointments.

## 2019-01-04 ENCOUNTER — Telehealth: Payer: Self-pay | Admitting: Family Medicine

## 2019-01-09 NOTE — Telephone Encounter (Signed)
Last office visit 12/23/2017 for CPE.  Last refilled 09/27/2018 for #90 with no refills.  No future appointments.  Please forward to Robin to schedule CPE with fasting labs prior after refill.

## 2019-01-10 NOTE — Telephone Encounter (Signed)
Robin, please schedule CPE with fasting labs prior.  Must schedule appointment before this medication can be refilled.  Please send back to me once appointment is made for her to refill. Thanks!

## 2019-01-11 ENCOUNTER — Other Ambulatory Visit: Payer: Self-pay

## 2019-01-11 NOTE — Telephone Encounter (Signed)
Name of Medication: tramadol 50 mg Name of [facility] Written Date and Quantity: # 68 on 09/27/18 Last Office Visit and Type: 12/23/2017 annual Next Office Visit and Type: none scheduled

## 2019-01-13 NOTE — Telephone Encounter (Signed)
Left message asking pt to call office  °

## 2019-01-27 NOTE — Telephone Encounter (Signed)
Left message asking pt to call office  °

## 2019-02-01 ENCOUNTER — Encounter: Payer: Self-pay | Admitting: Family Medicine

## 2019-02-01 NOTE — Telephone Encounter (Signed)
Left message asking pt to call office mailed letter

## 2019-02-07 ENCOUNTER — Other Ambulatory Visit: Payer: Self-pay | Admitting: Family Medicine

## 2019-02-11 ENCOUNTER — Other Ambulatory Visit: Payer: Self-pay | Admitting: Family Medicine

## 2019-02-13 NOTE — Telephone Encounter (Signed)
Last office visit 12/23/2017 for CPE.  Last refilled 12/14/2018 for #60 with 1 refill.  No future appointments

## 2019-02-20 ENCOUNTER — Other Ambulatory Visit: Payer: Self-pay | Admitting: Family Medicine

## 2019-02-20 NOTE — Telephone Encounter (Signed)
Last office visit 12/23/2017 for CPE.  Last refilled 09/27/2018 for #90 with no refills.  CPE scheduled for 03/02/2019.

## 2019-02-23 ENCOUNTER — Ambulatory Visit: Payer: BC Managed Care – PPO | Attending: Internal Medicine

## 2019-02-23 ENCOUNTER — Telehealth: Payer: Self-pay | Admitting: *Deleted

## 2019-02-23 DIAGNOSIS — Z20822 Contact with and (suspected) exposure to covid-19: Secondary | ICD-10-CM

## 2019-02-23 NOTE — Telephone Encounter (Signed)
[facility] for Tramadol.  PA completed on CoverMyMeds and sent for review.  Can take up to 72 hours for a decision.

## 2019-02-24 LAB — NOVEL CORONAVIRUS, NAA: SARS-CoV-2, NAA: NOT DETECTED

## 2019-02-24 NOTE — Telephone Encounter (Signed)
PA approved effective 02/23/2019 through 08/23/2019.  [facility] notified of [facility] via fax.

## 2019-02-27 ENCOUNTER — Telehealth: Payer: Self-pay | Admitting: Family Medicine

## 2019-02-27 ENCOUNTER — Other Ambulatory Visit (INDEPENDENT_AMBULATORY_CARE_PROVIDER_SITE_OTHER): Payer: BC Managed Care – PPO

## 2019-02-27 DIAGNOSIS — R7303 Prediabetes: Secondary | ICD-10-CM | POA: Diagnosis not present

## 2019-02-27 DIAGNOSIS — Z1321 Encounter for screening for nutritional disorder: Secondary | ICD-10-CM

## 2019-02-27 DIAGNOSIS — E7800 Pure hypercholesterolemia, unspecified: Secondary | ICD-10-CM

## 2019-02-27 LAB — LIPID PANEL
Cholesterol: 245 mg/dL — ABNORMAL HIGH (ref 0–200)
HDL: 63.2 mg/dL (ref >39.00)
LDL Cholesterol: 156 mg/dL — ABNORMAL HIGH (ref 0–99)
NonHDL: 182.17
Total CHOL/HDL Ratio: 4
Triglycerides: 131 mg/dL (ref 0.0–149.0)
VLDL: 26.2 mg/dL (ref 0.0–40.0)

## 2019-02-27 LAB — COMPREHENSIVE METABOLIC PANEL
ALT: 30 U/L (ref 0–35)
AST: 20 U/L (ref 0–37)
Albumin: 4 g/dL (ref 3.5–5.2)
Alkaline Phosphatase: 94 U/L (ref 39–117)
BUN: 10 mg/dL (ref 6–23)
CO2: 28 mEq/L (ref 19–32)
Calcium: 9.2 mg/dL (ref 8.4–10.5)
Chloride: 103 mEq/L (ref 96–112)
Creatinine, Ser: 0.8 mg/dL (ref 0.40–1.20)
GFR: 72.4 mL/min (ref >60.00)
Glucose, Bld: 106 mg/dL — ABNORMAL HIGH (ref 70–99)
Potassium: 4.4 mEq/L (ref 3.5–5.1)
Sodium: 140 mEq/L (ref 135–145)
Total Bilirubin: 0.4 mg/dL (ref 0.2–1.2)
Total Protein: 6.5 g/dL (ref 6.0–8.3)

## 2019-02-27 LAB — HEMOGLOBIN A1C: Hgb A1c MFr Bld: 6.1 % (ref 4.6–6.5)

## 2019-02-27 LAB — VITAMIN D 25 HYDROXY (VIT D DEFICIENCY, FRACTURES): VITD: 38.84 ng/mL (ref 30.00–100.00)

## 2019-02-27 NOTE — Telephone Encounter (Signed)
-----   Message from Ellamae Sia sent at 02/21/2019  2:40 PM EST ----- Regarding: Lab orders for Monday, 1.18.21 Patient is scheduled for CPX labs, please order future labs, Thanks , Karna Christmas

## 2019-02-28 NOTE — Progress Notes (Signed)
No critical labs need to be addressed urgently. We will discuss labs in detail at upcoming office visit.   

## 2019-03-02 ENCOUNTER — Encounter: Payer: BC Managed Care – PPO | Admitting: Family Medicine

## 2019-03-09 ENCOUNTER — Other Ambulatory Visit: Payer: Self-pay | Admitting: Family Medicine

## 2019-03-09 NOTE — Addendum Note (Signed)
Addended by: Carter Kitten on: 03/09/2019 08:30 AM   Modules accepted: Orders

## 2019-03-09 NOTE — Telephone Encounter (Signed)
Patient is on Losartan 50 mg daily.  I thought I had refused refill but apparently hit approve instead.  [facility] [address] and [facility]ill on Losartan 100 mg.

## 2019-03-28 ENCOUNTER — Other Ambulatory Visit: Payer: Self-pay

## 2019-03-28 ENCOUNTER — Encounter: Payer: Self-pay | Admitting: Family Medicine

## 2019-03-28 ENCOUNTER — Ambulatory Visit (INDEPENDENT_AMBULATORY_CARE_PROVIDER_SITE_OTHER): Payer: BC Managed Care – PPO | Admitting: Family Medicine

## 2019-03-28 VITALS — BP 166/100 | HR 85 | Temp 97.5°F | Ht 61.0 in | Wt 152.0 lb

## 2019-03-28 DIAGNOSIS — Z0000 Encounter for general adult medical examination without abnormal findings: Secondary | ICD-10-CM

## 2019-03-28 DIAGNOSIS — I10 Essential (primary) hypertension: Secondary | ICD-10-CM

## 2019-03-28 DIAGNOSIS — E7800 Pure hypercholesterolemia, unspecified: Secondary | ICD-10-CM

## 2019-03-28 DIAGNOSIS — M797 Fibromyalgia: Secondary | ICD-10-CM

## 2019-03-28 DIAGNOSIS — F3341 Major depressive disorder, recurrent, in partial remission: Secondary | ICD-10-CM

## 2019-03-28 DIAGNOSIS — R7303 Prediabetes: Secondary | ICD-10-CM | POA: Diagnosis not present

## 2019-03-28 MED ORDER — LOSARTAN POTASSIUM 100 MG PO TABS: 100.0000 mg | ORAL_TABLET | Freq: Every day | ORAL | 3 refills | Status: DC

## 2019-03-28 MED ORDER — DULOXETINE HCL 30 MG PO CPEP: 30.0000 mg | ORAL_CAPSULE | Freq: Every day | ORAL | 3 refills | Status: DC

## 2019-03-28 NOTE — Patient Instructions (Addendum)
Trial of increase in cymbalta to 90 mg daily.  Call with update or make follow up in 4 weeks. Work on low cholesterol diet and increase exercise as able. Stop at lab on way out to pick up stool cards.

## 2019-03-28 NOTE — Progress Notes (Signed)
Chief Complaint  Patient presents with  . Annual Exam    History of Present Illness: HPI  The patient is here for annual wellness exam and preventative care.    Hypertension:    Poor control in office today.  ON HCTZ 25 mg daily... has not been taking losartan given issue with pharmacy in last 2 week Using medication without problems or lightheadedness: none Chest pain with exertion:none Edema:none Short of breath:none Average home BPs: Other issues:   MDD:   Poor control on cymbalta in last several months.  very poor sleep... sleeping  4-12 hours.. sometimes cannot sleep, sometimes  Sleeps to much.  Using melatonin 1 mg daily.  Fibromyalgia and low back pain: Using tramadol at bedtime and occ during the day. Using #90 in 1 month.   prediabetes :  Lab Results  Component Value Date   HGBA1C 6.1 02/27/2019    Elevated Cholesterol:  Poor control Lab Results  Component Value Date   CHOL 245 (H) 02/27/2019   HDL 63.20 02/27/2019   LDLCALC 156 (H) 02/27/2019   LDLDIRECT 153.4 11/15/2012   TRIG 131.0 02/27/2019   CHOLHDL 4 02/27/2019  Using medications without problems: Muscle aches:  Diet compliance: moderate to poor lately. Exercise: walking some off an on. Other complaints:     This visit occurred during the SARS-CoV-2 public health emergency.  Safety protocols were in place, including screening questions prior to the visit, additional usage of staff PPE, and extensive cleaning of exam room while observing appropriate contact time as indicated for disinfecting solutions.   COVID 19 screen:  No recent travel or known exposure to COVID19 The patient denies respiratory symptoms of COVID 19 at this time. The importance of social distancing was discussed today.     Review of Systems  Constitutional: Negative for chills and fever.  HENT: Negative for congestion and ear pain.   Eyes: Negative for pain and redness.  Respiratory: Negative for cough and shortness of  breath.   Cardiovascular: Negative for chest pain, palpitations and leg swelling.  Gastrointestinal: Negative for abdominal pain, blood in stool, constipation, diarrhea, nausea and vomiting.  Genitourinary: Negative for dysuria.  Musculoskeletal: Negative for falls and myalgias.  Skin: Negative for rash.  Neurological: Negative for dizziness.  Psychiatric/Behavioral: Negative for depression. The patient is not nervous/anxious.       Past Medical History:  Diagnosis Date  . Allergic rhinitis   . Pelvis fracture ([place]) 1992   traction x 1 month, L.L. pneumothorax     reports that she quit smoking about 24 years ago. She quit after 30.00 years of use. She has never used smokeless tobacco. She reports that she does not drink alcohol or use drugs.   Current Outpatient Medications:  .  cyclobenzaprine (FLEXERIL) 10 MG tablet, TAKE 1 TO 2 TABLETS BY MOUTH DAILY AT BEDTIME, Disp: 60 tablet, Rfl: 1 .  DULoxetine (CYMBALTA) 60 MG capsule, TAKE 1 CAPSULE BY MOUTH EVERY DAY, Disp: 90 capsule, Rfl: 0 .  hydrochlorothiazide (HYDRODIURIL) 25 MG tablet, TAKE 1 TABLET BY MOUTH EVERY DAY, Disp: 90 tablet, Rfl: 1 .  Melatonin 1 MG TABS, Take 1 tablet by mouth at bedtime as needed. , Disp: , Rfl:  .  senna (SENOKOT) 8.6 MG tablet, Take 4 tablets by mouth daily., Disp: , Rfl:  .  traMADol (ULTRAM) 50 MG tablet, TAKE 1 TABLET BY MOUTH EVERY MORNING AND 2 TABLETS BY MOUTH EVERY EVENING, Disp: 90 tablet, Rfl: 0   Observations/Objective: Blood pressure Marland Kitchen)  166/100, pulse 85, temperature (!) 97.5 F (36.4 C), temperature source Temporal, height 5\' 1"  (1.549 m), weight 152 lb (68.9 kg), SpO2 97 %.  Physical Exam Constitutional:      General: She is not in acute distress.    Appearance: Normal appearance. She is well-developed. She is not ill-appearing or toxic-appearing.  HENT:     Head: Normocephalic.     Right Ear: Hearing, tympanic membrane, ear canal and external ear normal.     Left Ear: Hearing,  tympanic membrane, ear canal and external ear normal.     Nose: Nose normal.  Eyes:     General: Lids are normal. Lids are everted, no foreign bodies appreciated.     Conjunctiva/sclera: Conjunctivae normal.     Pupils: Pupils are equal, round, and reactive to light.  Neck:     Thyroid: No thyroid mass or thyromegaly.     Vascular: No carotid bruit.     Trachea: Trachea normal.  Cardiovascular:     Rate and Rhythm: Normal rate and regular rhythm.     Heart sounds: Normal heart sounds, S1 normal and S2 normal. No murmur. No gallop.   Pulmonary:     Effort: Pulmonary effort is normal. No respiratory distress.     Breath sounds: Normal breath sounds. No wheezing, rhonchi or rales.  Abdominal:     General: Bowel sounds are normal. There is no distension or abdominal bruit.     Palpations: Abdomen is soft. There is no fluid wave or mass.     Tenderness: There is no abdominal tenderness. There is no guarding or rebound.     Hernia: No hernia is present.  Musculoskeletal:     Cervical back: Normal range of motion and neck supple.  Lymphadenopathy:     Cervical: No cervical adenopathy.  Skin:    General: Skin is warm and dry.     Findings: No rash.  Neurological:     Mental Status: She is alert.     Cranial Nerves: No cranial nerve deficit.     Sensory: No sensory deficit.  Psychiatric:        Mood and Affect: Mood is not anxious or depressed.        Speech: Speech normal.        Behavior: Behavior normal. Behavior is cooperative.        Judgment: Judgment normal.      Assessment and Plan The patient's preventative maintenance and recommended screening tests for an annual wellness exam were reviewed in full today. Brought up to date unless services declined.  Counselled on the importance of diet, exercise, and its role in overall health and mortality. The patient's FH and SH was reviewed, including their home life, tobacco status, and drug and alcohol status.   Last pap 2018,  on q3 year schedule. Plan last at age 77.  No family history of ovarian  uterine cancer. Colon cancer screening: ifob neg 2019.Marland Kitchen Repeat due mammogram 02/2018 Vaccines: uptodate with Tdap, flu DEXA: 02/2018 Former smoker 20 pack year history, QUIT remotely 1996 Hep C: done [id number]   Hypertension Restart losartan and follow BP at home. Reviewed goals and lifestyle changes.  Depression, major, recurrent Inadequate control.  Trial of 90 mg daily.. follow up in 4 weeks.  Fibromyalgia Tramadol prn.  Prediabetes  Stable control with diet.  Pure hypercholesterolemia Work on low cholesterol diet and increase exercise as able.     Eliezer Lofts, MD

## 2019-04-12 ENCOUNTER — Other Ambulatory Visit: Payer: Self-pay | Admitting: Family Medicine

## 2019-04-12 NOTE — Telephone Encounter (Signed)
Last office visit 03/28/2019 for CPE.  Last refilled 02/13/2019 for #60 with 1 refill.  No future appointments.

## 2019-04-17 NOTE — Assessment & Plan Note (Signed)
Inadequate control.  Trial of 90 mg daily.. follow up in 4 weeks.

## 2019-04-17 NOTE — Assessment & Plan Note (Signed)
Restart losartan and follow BP at home. Reviewed goals and lifestyle changes.

## 2019-04-17 NOTE — Assessment & Plan Note (Signed)
Tramadol prn 

## 2019-04-17 NOTE — Assessment & Plan Note (Signed)
Work on low cholesterol diet and increase exercise as able.

## 2019-04-17 NOTE — Assessment & Plan Note (Signed)
Stable control with diet.  

## 2019-04-19 ENCOUNTER — Other Ambulatory Visit: Payer: Self-pay | Admitting: Family Medicine

## 2019-05-16 ENCOUNTER — Other Ambulatory Visit: Payer: Self-pay | Admitting: Family Medicine

## 2019-05-18 ENCOUNTER — Other Ambulatory Visit: Payer: Self-pay | Admitting: Family Medicine

## 2019-05-18 ENCOUNTER — Other Ambulatory Visit (INDEPENDENT_AMBULATORY_CARE_PROVIDER_SITE_OTHER): Payer: BC Managed Care – PPO

## 2019-05-18 DIAGNOSIS — Z1211 Encounter for screening for malignant neoplasm of colon: Secondary | ICD-10-CM | POA: Diagnosis not present

## 2019-05-18 LAB — FECAL OCCULT BLOOD, IMMUNOCHEMICAL: Fecal Occult Bld: NEGATIVE

## 2019-05-22 ENCOUNTER — Other Ambulatory Visit: Payer: Self-pay | Admitting: Family Medicine

## 2019-05-22 NOTE — Telephone Encounter (Signed)
Last office visit 03/28/2019 for CPE.  Last refilled 02/20/2019 for #90 with no refills.  No future appointments.

## 2019-06-03 ENCOUNTER — Other Ambulatory Visit: Payer: Self-pay | Admitting: Family Medicine

## 2019-06-04 NOTE — Telephone Encounter (Signed)
Last office visit 03/28/19 for CPE.  Last refilled 04/12/19 for #60 with 1 refill.  No future appointments.

## 2019-07-12 ENCOUNTER — Other Ambulatory Visit: Payer: Self-pay | Admitting: Family Medicine

## 2019-07-24 ENCOUNTER — Other Ambulatory Visit: Payer: Self-pay | Admitting: Family Medicine

## 2019-07-24 NOTE — Telephone Encounter (Signed)
Last office visit 04/07/2019 for CPE.  Last refilled 06/06/2019 for #60 with 1 refill.  No future appointments.

## 2019-09-23 ENCOUNTER — Other Ambulatory Visit: Payer: Self-pay | Admitting: Family Medicine

## 2019-09-25 NOTE — Telephone Encounter (Signed)
Last office visit 03/28/2019 for CPE.  Last refilled 07/24/2019 for #60 with 1 refill.  No future appointments.

## 2019-11-19 ENCOUNTER — Other Ambulatory Visit: Payer: Self-pay | Admitting: Family Medicine

## 2019-11-20 NOTE — Telephone Encounter (Signed)
Last office visit 03/28/2019 for CPE.  Last refilled 09/25/2019 for #60 with 1 refill.  No future appointments.

## 2020-01-12 ENCOUNTER — Other Ambulatory Visit: Payer: Self-pay | Admitting: Family Medicine

## 2020-01-12 NOTE — Telephone Encounter (Signed)
Last office visit 02.16.2021 for CPE.  Last refilled 11/20/2019 for #60 with 1 refill.  No future appointments.

## 2020-01-15 ENCOUNTER — Other Ambulatory Visit: Payer: Self-pay | Admitting: Family Medicine

## 2020-01-16 NOTE — Telephone Encounter (Signed)
Last office visit 03/28/2019 for CPE.  Last refilled 05/23/2019 for #90 with no refills.  No future appointments.

## 2020-01-20 ENCOUNTER — Other Ambulatory Visit: Payer: Self-pay | Admitting: Family Medicine

## 2020-02-22 ENCOUNTER — Other Ambulatory Visit: Payer: Self-pay | Admitting: Family Medicine

## 2020-03-18 ENCOUNTER — Telehealth: Payer: Self-pay | Admitting: Family Medicine

## 2020-03-18 DIAGNOSIS — R7303 Prediabetes: Secondary | ICD-10-CM

## 2020-03-18 NOTE — Telephone Encounter (Signed)
-----   Message from Cloyd Stagers, RT sent at 03/12/2020 11:36 AM EST ----- Regarding: Lab Orders for Wednesday 2.16.2022 Please place lab orders for Wednesday 2.16.2022, office visit for physical on Tuesday 2.22.2022 Thank you, Dyke Maes RT(R)

## 2020-03-20 ENCOUNTER — Other Ambulatory Visit: Payer: Self-pay | Admitting: Family Medicine

## 2020-03-20 NOTE — Telephone Encounter (Signed)
Last office visit 03/28/2019 for CPE.  Last refilled 01/12/2020 for #60 with 1 refill.  CPE scheduled for 04/02/20.

## 2020-03-27 ENCOUNTER — Other Ambulatory Visit: Payer: Self-pay

## 2020-03-27 ENCOUNTER — Other Ambulatory Visit (INDEPENDENT_AMBULATORY_CARE_PROVIDER_SITE_OTHER): Payer: BC Managed Care – PPO

## 2020-03-27 DIAGNOSIS — R7303 Prediabetes: Secondary | ICD-10-CM | POA: Diagnosis not present

## 2020-03-27 LAB — COMPREHENSIVE METABOLIC PANEL
ALT: 49 U/L — ABNORMAL HIGH (ref 0–35)
AST: 40 U/L — ABNORMAL HIGH (ref 0–37)
Albumin: 4 g/dL (ref 3.5–5.2)
Alkaline Phosphatase: 80 U/L (ref 39–117)
BUN: 11 mg/dL (ref 6–23)
CO2: 27 mEq/L (ref 19–32)
Calcium: 9.4 mg/dL (ref 8.4–10.5)
Chloride: 102 mEq/L (ref 96–112)
Creatinine, Ser: 0.87 mg/dL (ref 0.40–1.20)
GFR: 70.47 mL/min (ref >60.00)
Glucose, Bld: 89 mg/dL (ref 70–99)
Potassium: 3.7 mEq/L (ref 3.5–5.1)
Sodium: 139 mEq/L (ref 135–145)
Total Bilirubin: 0.4 mg/dL (ref 0.2–1.2)
Total Protein: 6.9 g/dL (ref 6.0–8.3)

## 2020-03-27 LAB — LIPID PANEL
Cholesterol: 226 mg/dL — ABNORMAL HIGH (ref 0–200)
HDL: 61.9 mg/dL (ref >39.00)
LDL Cholesterol: 141 mg/dL — ABNORMAL HIGH (ref 0–99)
NonHDL: 163.93
Total CHOL/HDL Ratio: 4
Triglycerides: 116 mg/dL (ref 0.0–149.0)
VLDL: 23.2 mg/dL (ref 0.0–40.0)

## 2020-03-27 LAB — HEMOGLOBIN A1C: Hgb A1c MFr Bld: 6 % (ref 4.6–6.5)

## 2020-03-27 NOTE — Progress Notes (Signed)
No critical labs need to be addressed urgently. We will discuss labs in detail at upcoming office visit.   

## 2020-04-02 ENCOUNTER — Ambulatory Visit (INDEPENDENT_AMBULATORY_CARE_PROVIDER_SITE_OTHER): Payer: BC Managed Care – PPO | Admitting: Family Medicine

## 2020-04-02 ENCOUNTER — Encounter: Payer: Self-pay | Admitting: Family Medicine

## 2020-04-02 ENCOUNTER — Other Ambulatory Visit: Payer: Self-pay

## 2020-04-02 VITALS — BP 122/80 | HR 102 | Temp 97.2°F | Ht 62.0 in | Wt 149.0 lb

## 2020-04-02 DIAGNOSIS — Z23 Encounter for immunization: Secondary | ICD-10-CM | POA: Diagnosis not present

## 2020-04-02 DIAGNOSIS — Z0000 Encounter for general adult medical examination without abnormal findings: Secondary | ICD-10-CM | POA: Diagnosis not present

## 2020-04-02 DIAGNOSIS — Z1211 Encounter for screening for malignant neoplasm of colon: Secondary | ICD-10-CM

## 2020-04-02 DIAGNOSIS — E7800 Pure hypercholesterolemia, unspecified: Secondary | ICD-10-CM

## 2020-04-02 DIAGNOSIS — R7303 Prediabetes: Secondary | ICD-10-CM

## 2020-04-02 DIAGNOSIS — I10 Essential (primary) hypertension: Secondary | ICD-10-CM | POA: Diagnosis not present

## 2020-04-02 DIAGNOSIS — F3341 Major depressive disorder, recurrent, in partial remission: Secondary | ICD-10-CM | POA: Diagnosis not present

## 2020-04-02 NOTE — Addendum Note (Signed)
Addended by: Sherrilee Gilles B on: 04/02/2020 02:50 PM   Modules accepted: Orders

## 2020-04-02 NOTE — Patient Instructions (Addendum)
Keep up regular exercise and healthy eating habits.Marland Kitchen low chol low carb diet. Call if shortness of breath with exertion worsening.  Pick up stool cards form lab on way out.  Call to set up mammogram on your own.  Return for second Shingrix vaccine in 2-6 months.

## 2020-04-02 NOTE — Assessment & Plan Note (Signed)
Stable, chronic.  Continue current medication.  Cymbalta 60 mg daily 

## 2020-04-02 NOTE — Progress Notes (Signed)
Patient ID: Katie Dennis, female    DOB: 1955/02/21, 65 y.o.   MRN: [id number]  This visit was conducted in person.  BP 122/80 (BP Location: Left Arm, Patient Position: Sitting, Cuff Size: Normal)   Pulse (!) 102   Temp (!) 97.2 F (36.2 C) (Temporal)   Ht 5\' 2"  (1.575 m)   Wt 149 lb (67.6 kg)   SpO2 97%   BMI 27.25 kg/m    CC:  Chief Complaint  Patient presents with  . Annual Exam   Subjective:   HPI: Katie Dennis is a 65 y.o. female presenting on 04/02/2020 for Annual Exam   She has been noting SOB over the last few months.. notes when exerting herself. Worse with low pressure systems. Not bad enough that she wants to do labs or EKG today. Occ palpitations associated. No allergies. Does feel tired.  Hx of smoking 25-35 pack year history  No sweating, nausea. No CP,  occ edema.   MDD, recurrent: Stable control on Cymbalta 60 mg daily. No interested in increasing dose.  Hypertension:  Good control on HCTZ 25 mg daily and losartan 100 mg daily BP Readings from Last 3 Encounters:  04/02/20 122/80  03/28/19 (!) 166/100  12/23/17 104/70  Using medication without problems or lightheadedness:  none Chest pain with exertion: none Edema:none Short of breath:none Average home BPs: not chekcing Other issues:   prediabetes  Lab Results  Component Value Date   HGBA1C 6.0 03/27/2020    Elevated Cholesterol:Tolerable control. Lab Results  Component Value Date   CHOL 226 (H) 03/27/2020   HDL 61.90 03/27/2020   LDLCALC 141 (H) 03/27/2020   LDLDIRECT 153.4 11/15/2012   TRIG 116.0 03/27/2020   CHOLHDL 4 03/27/2020  Using medications without problems: Muscle aches:  Diet compliance: good. Exercise: exercise bike 5 times a week until lately. Other complaints: The 10-year ASCVD risk score Mikey Bussing DC Brooke Bonito., et al., 2013) is: 6.2%   Values used to calculate the score:     Age: 21 years     Sex: Female     Is Non-Hispanic African American: No     Diabetic: No     Tobacco  smoker: No     Systolic Blood Pressure: 449 mmHg     Is BP treated: Yes     HDL Cholesterol: 61.9 mg/dL     Total Cholesterol: 226 mg/dL        Relevant past medical, surgical, family and social history reviewed and updated as indicated. Interim medical history since our last visit reviewed. Allergies and medications reviewed and updated. Outpatient Medications Prior to Visit  Medication Sig Dispense Refill  . cyclobenzaprine (FLEXERIL) 10 MG tablet TAKE 1 TO 2 TABLETS BY MOUTH DAILY AT BEDTIME 60 tablet 1  . DULoxetine (CYMBALTA) 60 MG capsule TAKE 1 CAPSULE BY MOUTH EVERY DAY 90 capsule 0  . hydrochlorothiazide (HYDRODIURIL) 25 MG tablet TAKE 1 TABLET BY MOUTH EVERY DAY 90 tablet 0  . losartan (COZAAR) 100 MG tablet Take 1 tablet (100 mg total) by mouth daily. 90 tablet 3  . Melatonin 1 MG TABS Take 1 tablet by mouth at bedtime as needed.     . senna (SENOKOT) 8.6 MG tablet Take 4 tablets by mouth daily.    . traMADol (ULTRAM) 50 MG tablet TAKE 1 TABLET BY MOUTH EVERY MORNING AND 2 TABLETS BY MOUTH EVERY EVENING 90 tablet 0  . DULoxetine (CYMBALTA) 30 MG capsule Take 30 mg by mouth daily. (Patient  not taking: Reported on 04/02/2020)     No facility-administered medications prior to visit.     Per HPI unless specifically indicated in ROS section below Review of Systems  Constitutional: Negative for fatigue and fever.  HENT: Negative for congestion.   Eyes: Negative for pain.  Respiratory: Positive for shortness of breath. Negative for cough.   Cardiovascular: Negative for chest pain, palpitations and leg swelling.  Gastrointestinal: Negative for abdominal pain.  Genitourinary: Negative for dysuria and vaginal bleeding.  Musculoskeletal: Negative for back pain.  Neurological: Negative for syncope, light-headedness and headaches.  Psychiatric/Behavioral: Negative for dysphoric mood.   Objective:  BP 122/80 (BP Location: Left Arm, Patient Position: Sitting, Cuff Size: Normal)    Pulse (!) 102   Temp (!) 97.2 F (36.2 C) (Temporal)   Ht 5\' 2"  (1.575 m)   Wt 149 lb (67.6 kg)   SpO2 97%   BMI 27.25 kg/m   Wt Readings from Last 3 Encounters:  04/02/20 149 lb (67.6 kg)  03/28/19 152 lb (68.9 kg)  12/23/17 151 lb 12 oz (68.8 kg)      Physical Exam Constitutional:      General: She is not in acute distress.Vital signs are normal.     Appearance: Normal appearance. She is well-developed and well-nourished. She is not ill-appearing or toxic-appearing.  HENT:     Head: Normocephalic.     Right Ear: Hearing, tympanic membrane, ear canal and external ear normal. Tympanic membrane is not erythematous, retracted or bulging.     Left Ear: Hearing, tympanic membrane, ear canal and external ear normal. Tympanic membrane is not erythematous, retracted or bulging.     Nose: No mucosal edema or rhinorrhea.     Right Sinus: No maxillary sinus tenderness or frontal sinus tenderness.     Left Sinus: No maxillary sinus tenderness or frontal sinus tenderness.     Mouth/Throat:     Mouth: Oropharynx is clear and moist and mucous membranes are normal.     Pharynx: Uvula midline.  Eyes:     General: Lids are normal. Lids are everted, no foreign bodies appreciated.     Extraocular Movements: EOM normal.     Conjunctiva/sclera: Conjunctivae normal.     Pupils: Pupils are equal, round, and reactive to light.  Neck:     Thyroid: No thyroid mass or thyromegaly.     Vascular: No carotid bruit.     Trachea: Trachea normal.  Cardiovascular:     Rate and Rhythm: Regular rhythm. Tachycardia present.     Pulses: Normal pulses and intact distal pulses.     Heart sounds: Normal heart sounds, S1 normal and S2 normal. No murmur heard. No friction rub. No gallop.   Pulmonary:     Effort: Pulmonary effort is normal. No tachypnea or respiratory distress.     Breath sounds: Normal breath sounds. No decreased breath sounds, wheezing, rhonchi or rales.  Abdominal:     General: Bowel sounds are  normal.     Palpations: Abdomen is soft.     Tenderness: There is no abdominal tenderness.  Musculoskeletal:     Cervical back: Normal range of motion and neck supple.  Skin:    General: Skin is warm, dry and intact.     Findings: No rash.  Neurological:     Mental Status: She is alert.  Psychiatric:        Mood and Affect: Mood is not anxious or depressed.        Speech: Speech  normal.        Behavior: Behavior normal. Behavior is cooperative.        Thought Content: Thought content normal.        Cognition and Memory: Cognition and memory normal.        Judgment: Judgment normal.       Results for orders placed or performed in visit on 03/27/20  Comprehensive metabolic panel  Result Value Ref Range   Sodium 139 135 - 145 mEq/L   Potassium 3.7 3.5 - 5.1 mEq/L   Chloride 102 96 - 112 mEq/L   CO2 27 19 - 32 mEq/L   Glucose, Bld 89 70 - 99 mg/dL   BUN 11 6 - 23 mg/dL   Creatinine, Ser 0.87 0.40 - 1.20 mg/dL   Total Bilirubin 0.4 0.2 - 1.2 mg/dL   Alkaline Phosphatase 80 39 - 117 U/L   AST 40 (H) 0 - 37 U/L   ALT 49 (H) 0 - 35 U/L   Total Protein 6.9 6.0 - 8.3 g/dL   Albumin 4.0 3.5 - 5.2 g/dL   GFR 70.47 >60.00 mL/min   Calcium 9.4 8.4 - 10.5 mg/dL  Lipid panel  Result Value Ref Range   Cholesterol 226 (H) 0 - 200 mg/dL   Triglycerides 116.0 0.0 - 149.0 mg/dL   HDL 61.90 >39.00 mg/dL   VLDL 23.2 0.0 - 40.0 mg/dL   LDL Cholesterol 141 (H) 0 - 99 mg/dL   Total CHOL/HDL Ratio 4    NonHDL 163.93   Hemoglobin A1c  Result Value Ref Range   Hgb A1c MFr Bld 6.0 4.6 - 6.5 %    This visit occurred during the SARS-CoV-2 public health emergency.  Safety protocols were in place, including screening questions prior to the visit, additional usage of staff PPE, and extensive cleaning of exam room while observing appropriate contact time as indicated for disinfecting solutions.   COVID 19 screen:  No recent travel or known exposure to COVID19 The patient denies respiratory  symptoms of COVID 19 at this time. The importance of social distancing was discussed today.   Assessment and Plan The patient's preventative maintenance and recommended screening tests for an annual wellness exam were reviewed in full today. Brought up to date unless services declined.  Counselled on the importance of diet, exercise, and its role in overall health and mortality. The patient's FH and SH was reviewed, including their home life, tobacco status, and drug and alcohol status.   Last pap 2018 nml, on q5 year schedule. Plan last at age 47. No family history of ovarian uterine cancer. Colon cancer screening: ifob neg 05/2019 mammogram 02/2018 repeat due Vaccines: uptodate with Tdap, flu, COVID x 3 DEXA: 02/2018 osteopenia repea in 5 years Former smoker 25-35 pack year history, QUIT remotely [place]: done [id number]  Problem List Items Addressed This Visit    Depression, major, recurrent (HCC) (Chronic)    Stable, chronic.  Continue current medication.   Cymbalta 60 mg daily.      Hypertension (Chronic)    Good control on HCTZ 25 mg daily and losartan 100 mg daily       Prediabetes (Chronic)    Diet controlled. Encouraged exercise, weight loss, healthy eating habits.       Pure hypercholesterolemia (Chronic)    Tolerable control with diet. 6.2% 10 year risk of CVD.       Other Visit Diagnoses    Routine general medical examination at a health care facility    -  Primary   Colon cancer screening       Relevant Orders   Fecal occult blood, imunochemical      She does not wish to evaluate DOE or palpitations/tachycardia... she will call if worsening symptoms for labs eval and EKG, possible PFTs to eva for COPD given smoking history.    Eliezer Lofts, MD

## 2020-04-02 NOTE — Assessment & Plan Note (Signed)
Good control on HCTZ 25 mg daily and losartan 100 mg daily

## 2020-04-02 NOTE — Assessment & Plan Note (Signed)
Tolerable control with diet. 6.2% 10 year risk of CVD.

## 2020-04-02 NOTE — Assessment & Plan Note (Signed)
Diet controlled. Encouraged exercise, weight loss, healthy eating habits.  

## 2020-04-15 ENCOUNTER — Other Ambulatory Visit: Payer: Self-pay | Admitting: Family Medicine

## 2020-04-18 ENCOUNTER — Other Ambulatory Visit: Payer: Self-pay | Admitting: Family Medicine

## 2020-04-21 ENCOUNTER — Other Ambulatory Visit: Payer: Self-pay | Admitting: Family Medicine

## 2020-05-13 ENCOUNTER — Other Ambulatory Visit: Payer: Self-pay | Admitting: Family Medicine

## 2020-05-13 NOTE — Telephone Encounter (Signed)
Last office visit 04/02/2020 for CPE.  Last refilled 03/20/2020 for #60 with 1 refill.  No future appointments.

## 2020-05-31 ENCOUNTER — Other Ambulatory Visit (INDEPENDENT_AMBULATORY_CARE_PROVIDER_SITE_OTHER): Payer: BC Managed Care – PPO

## 2020-05-31 DIAGNOSIS — Z1211 Encounter for screening for malignant neoplasm of colon: Secondary | ICD-10-CM | POA: Diagnosis not present

## 2020-05-31 LAB — FECAL OCCULT BLOOD, IMMUNOCHEMICAL: Fecal Occult Bld: NEGATIVE

## 2020-07-08 ENCOUNTER — Other Ambulatory Visit: Payer: Self-pay | Admitting: Family Medicine

## 2020-07-10 NOTE — Telephone Encounter (Signed)
Pharmacy requests refill on: Cyclobenzaprine 10 mg    LAST REFILL: 05/13/2020 (Q-60, R-1) LAST OV: 04/02/2020 NEXT OV: Not Scheduled  PHARMACY: [place], [place]

## 2020-08-17 IMAGING — MG DIGITAL SCREENING BILATERAL MAMMOGRAM WITH TOMO AND CAD
8 series · 8 of 24 positions shown · non-contrast
Comparison: Previous exam(s).

CLINICAL DATA: Screening.

EXAM:
DIGITAL SCREENING BILATERAL MAMMOGRAM WITH TOMO AND CAD

[L CC synth-2D]
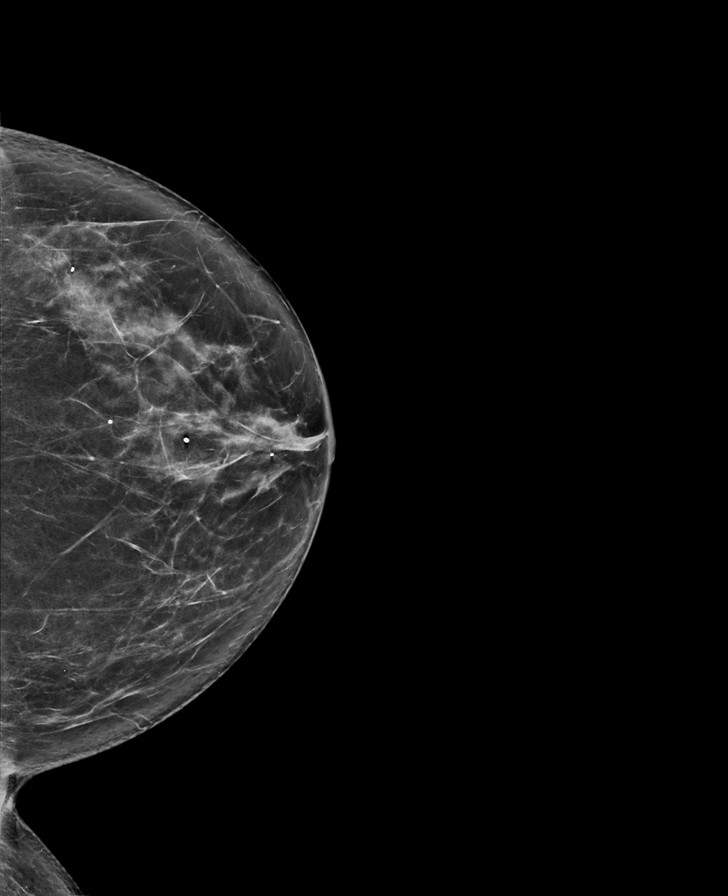

[R CC synth-2D]
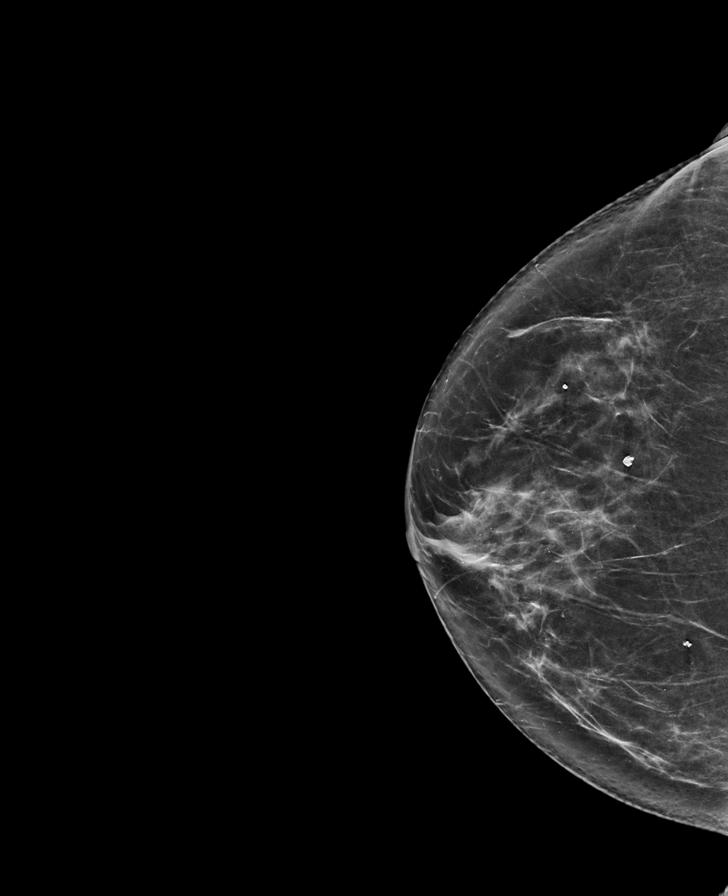

[R MLO synth-2D]
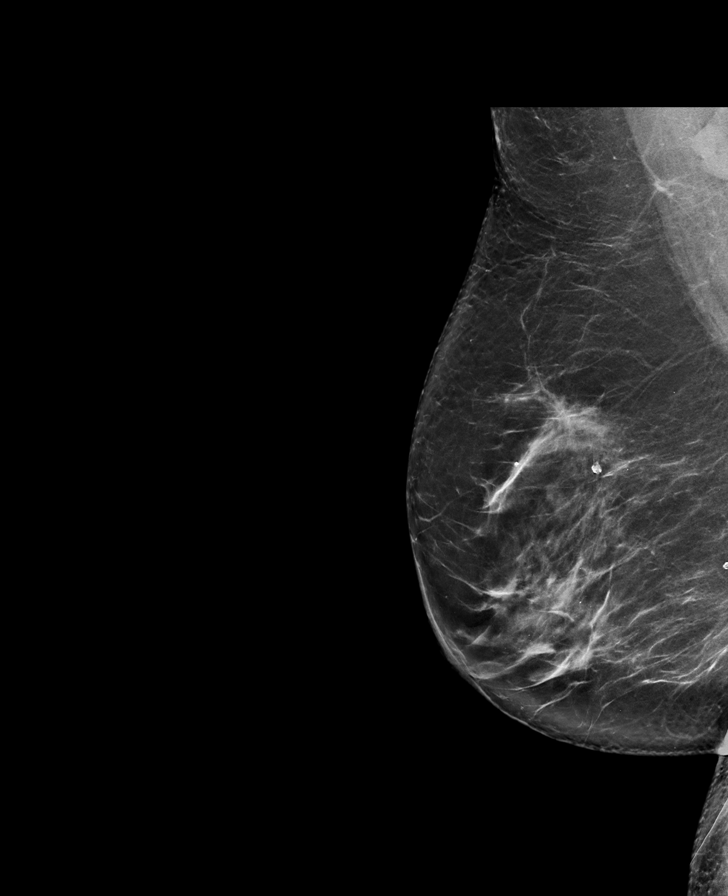

[L MLO synth-2D]
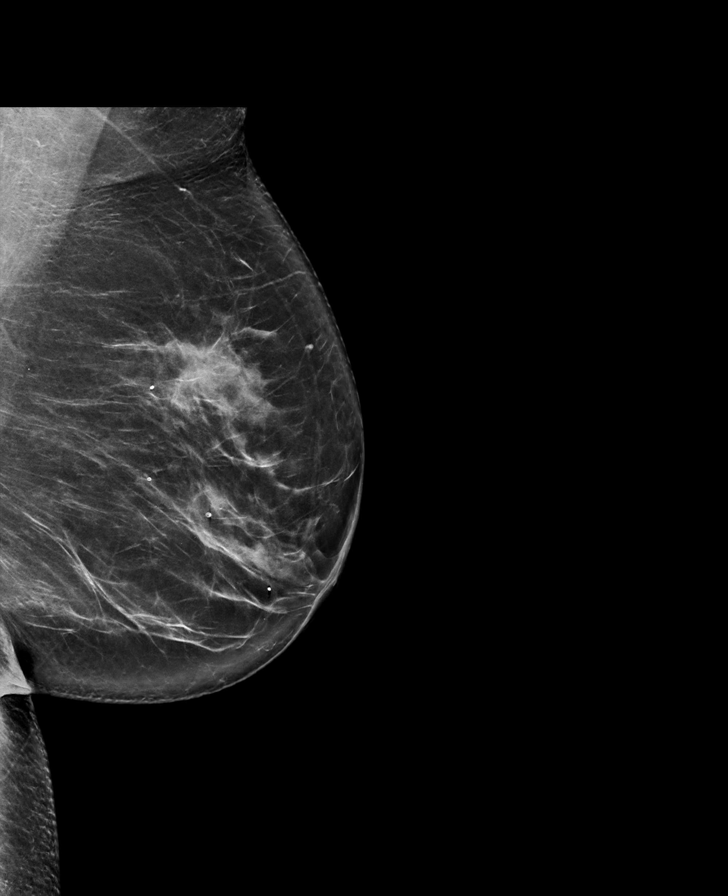

[R CC tomo · tomo slice 38/75.0]
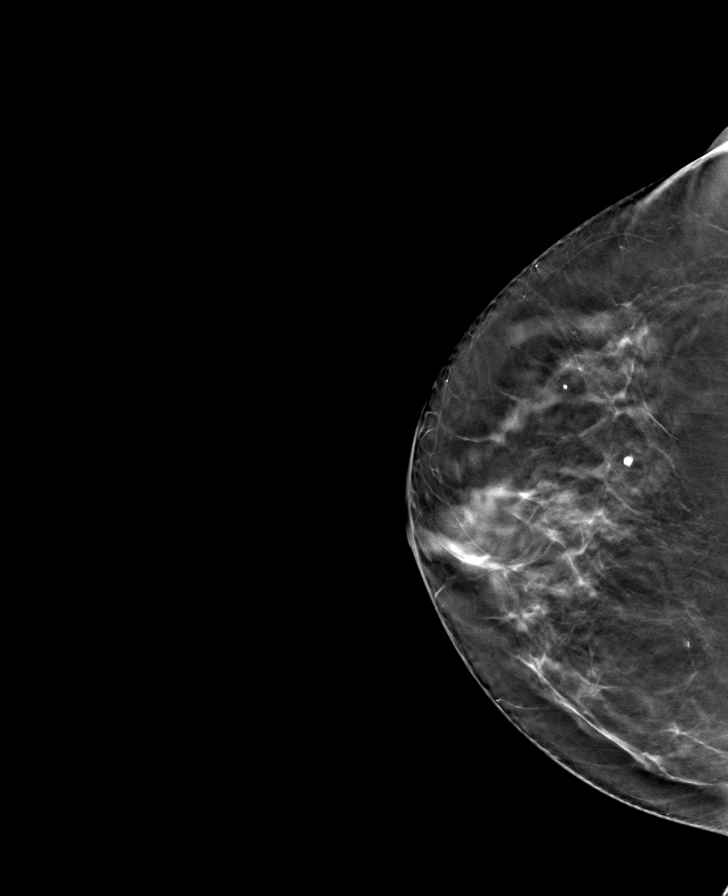

[L CC tomo · tomo slice 37/74.0]
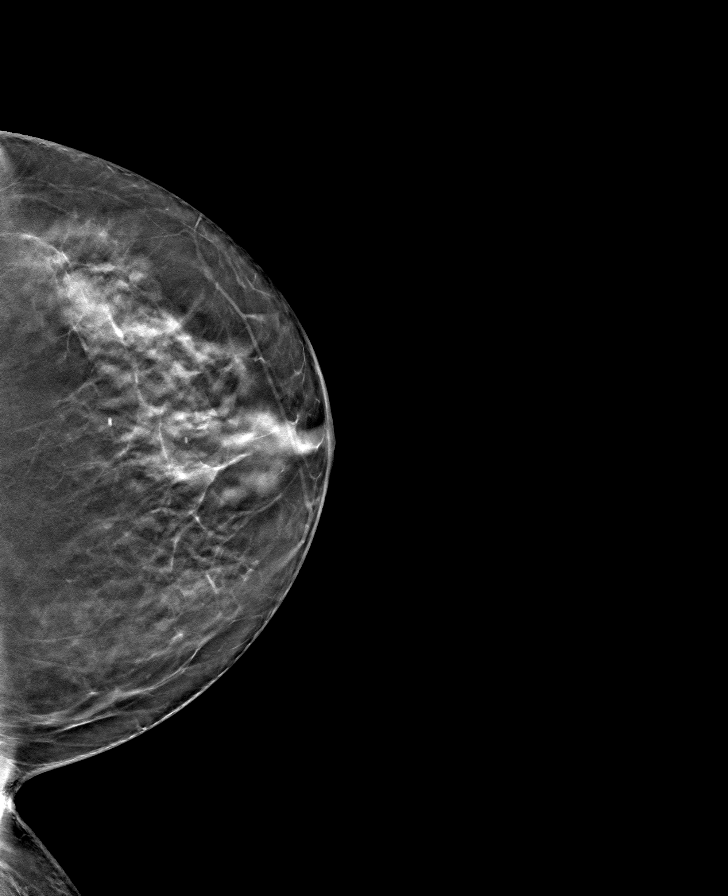

[R MLO tomo · tomo slice 43/86.0]
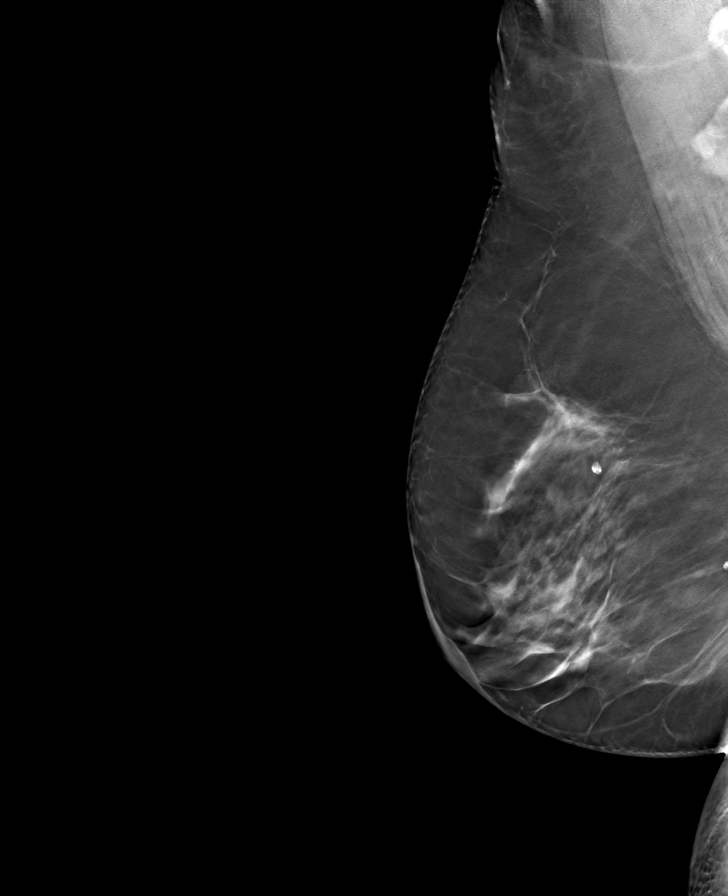

[L MLO tomo · tomo slice 43/86.0]
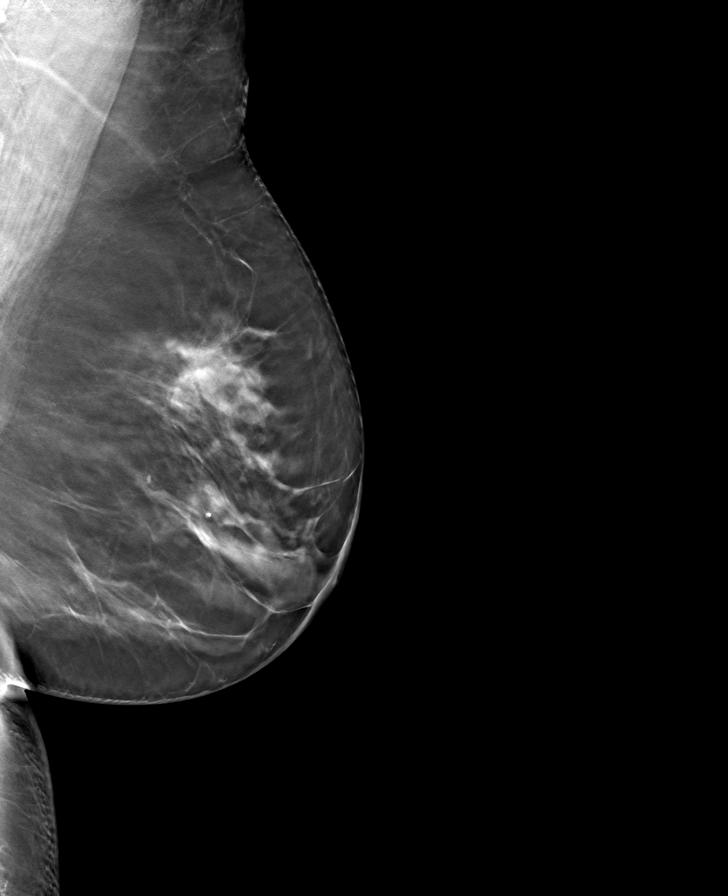

[8 of 24 positions shown; findings below may reference images not displayed]

ACR Breast Density Category c: The breast tissue is heterogeneously
dense, which may obscure small masses.
FINDINGS: There are no findings suspicious for malignancy. Images were
processed with CAD.
IMPRESSION: No mammographic evidence of malignancy. A result letter of this
screening mammogram will be mailed directly to the patient.

RECOMMENDATION:
Screening mammogram in one year. (Code:[id number])

BI-RADS CATEGORY  1: Negative.

## 2020-09-07 ENCOUNTER — Other Ambulatory Visit: Payer: Self-pay | Admitting: Family Medicine

## 2020-09-08 NOTE — Telephone Encounter (Signed)
Last office visit 04/02/20 for CPE.  Last refilled 07/11/20 for #60 with 1 refill.  No future appointments.

## 2020-10-14 ENCOUNTER — Other Ambulatory Visit: Payer: Self-pay | Admitting: Family Medicine

## 2020-10-15 ENCOUNTER — Telehealth: Payer: Self-pay | Admitting: *Deleted

## 2020-10-15 NOTE — Telephone Encounter (Signed)
Received fax from [facility] requesting PA for Tramadol 50 mg.  PA completed on CoverMyMeds and sent for review.  Can take up to 72 hours for a decision.

## 2020-10-15 NOTE — Telephone Encounter (Signed)
Last office visit 04/02/2020 for CPE.  Last refilled 01/16/2020 for #90 with no refills.  No future appointments.

## 2020-10-16 NOTE — Telephone Encounter (Signed)
PA approved.  [facility] notified of approval via fax.

## 2020-11-04 ENCOUNTER — Other Ambulatory Visit: Payer: Self-pay | Admitting: Family Medicine

## 2020-11-04 NOTE — Telephone Encounter (Signed)
Last office visit 04/02/2020 for CPE.  Last refilled 09/10/2020 for #60 with 1 refill.  No future appointments.

## 2020-12-13 DIAGNOSIS — Z23 Encounter for immunization: Secondary | ICD-10-CM | POA: Diagnosis not present

## 2020-12-28 ENCOUNTER — Other Ambulatory Visit: Payer: Self-pay | Admitting: Family Medicine

## 2020-12-30 NOTE — Telephone Encounter (Signed)
Last office visit 04/02/2020 for CPE.  Last refilled 11/04/2020 for #60 with 1 refill.  No future appointments.

## 2021-01-16 ENCOUNTER — Encounter: Payer: Self-pay | Admitting: Family Medicine

## 2021-02-22 ENCOUNTER — Other Ambulatory Visit: Payer: Self-pay | Admitting: Family Medicine

## 2021-02-23 NOTE — Telephone Encounter (Signed)
Last office visit 04/02/20 for CPE. Last refilled 12/30/20 for #60 with 1 refill.  No future appointments.

## 2021-04-11 ENCOUNTER — Other Ambulatory Visit: Payer: Self-pay | Admitting: Family Medicine

## 2021-04-11 NOTE — Telephone Encounter (Signed)
Please schedule CPE with fasting labs prior with Dr. Bedsole.  

## 2021-04-23 ENCOUNTER — Other Ambulatory Visit: Payer: Self-pay | Admitting: Family Medicine

## 2021-04-23 NOTE — Telephone Encounter (Signed)
Please schedule CPE with fasting labs prior with Dr. Bedsole.  

## 2021-04-27 ENCOUNTER — Other Ambulatory Visit: Payer: Self-pay | Admitting: Family Medicine

## 2021-04-28 NOTE — Telephone Encounter (Signed)
Refill request Flexeril ?Last refill 02/24/21 #60/1 ?Last office visit 04/02/20 ?

## 2021-05-23 ENCOUNTER — Other Ambulatory Visit: Payer: Self-pay | Admitting: Family Medicine

## 2021-05-25 ENCOUNTER — Encounter: Payer: Self-pay | Admitting: *Deleted

## 2021-05-25 NOTE — Telephone Encounter (Signed)
Last office visit 04/02/20 for CPE.  Last refilled 10/15/20 for #90 with no refills. No future appointments.  MyChart message sent to [facility] asking her to call office and schedule CPE.   ?

## 2021-06-23 ENCOUNTER — Other Ambulatory Visit: Payer: Self-pay | Admitting: Family Medicine

## 2021-06-23 NOTE — Telephone Encounter (Signed)
Last office visit 03/13/20 for CPE.  Last refilled 04/29/21 for #60 with 1 refill.  CPE scheduled 07/17/21. ?

## 2021-06-25 ENCOUNTER — Telehealth: Payer: Self-pay | Admitting: Family Medicine

## 2021-06-25 DIAGNOSIS — E7800 Pure hypercholesterolemia, unspecified: Secondary | ICD-10-CM

## 2021-06-25 DIAGNOSIS — R7303 Prediabetes: Secondary | ICD-10-CM

## 2021-06-25 NOTE — Telephone Encounter (Signed)
-----   Message from Velna Hatchet, RT sent at 06/23/2021  1:45 PM EDT ----- ?Regarding: Lab Tue 07/09/21 ?Patient is scheduled for cpx, please order future labs.  Thanks, Anda Kraft ? ? ?

## 2021-07-09 ENCOUNTER — Other Ambulatory Visit (INDEPENDENT_AMBULATORY_CARE_PROVIDER_SITE_OTHER): Payer: Medicare PPO

## 2021-07-09 DIAGNOSIS — R7303 Prediabetes: Secondary | ICD-10-CM

## 2021-07-09 DIAGNOSIS — E7800 Pure hypercholesterolemia, unspecified: Secondary | ICD-10-CM

## 2021-07-09 LAB — LIPID PANEL
Cholesterol: 186 mg/dL (ref 0–200)
HDL: 47.6 mg/dL (ref >39.00)
LDL Cholesterol: 117 mg/dL — ABNORMAL HIGH (ref 0–99)
NonHDL: 138.66
Total CHOL/HDL Ratio: 4
Triglycerides: 110 mg/dL (ref 0.0–149.0)
VLDL: 22 mg/dL (ref 0.0–40.0)

## 2021-07-09 LAB — COMPREHENSIVE METABOLIC PANEL
ALT: 28 U/L (ref 0–35)
AST: 29 U/L (ref 0–37)
Albumin: 3.9 g/dL (ref 3.5–5.2)
Alkaline Phosphatase: 83 U/L (ref 39–117)
BUN: 16 mg/dL (ref 6–23)
CO2: 24 mEq/L (ref 19–32)
Calcium: 9.3 mg/dL (ref 8.4–10.5)
Chloride: 102 mEq/L (ref 96–112)
Creatinine, Ser: 0.96 mg/dL (ref 0.40–1.20)
GFR: 62.06 mL/min (ref >60.00)
Glucose, Bld: 148 mg/dL — ABNORMAL HIGH (ref 70–99)
Potassium: 3.6 mEq/L (ref 3.5–5.1)
Sodium: 137 mEq/L (ref 135–145)
Total Bilirubin: 0.4 mg/dL (ref 0.2–1.2)
Total Protein: 6.7 g/dL (ref 6.0–8.3)

## 2021-07-09 LAB — HEMOGLOBIN A1C: Hgb A1c MFr Bld: 5.9 % (ref 4.6–6.5)

## 2021-07-10 NOTE — Progress Notes (Signed)
No critical labs need to be addressed urgently. We will discuss labs in detail at upcoming office visit.   

## 2021-07-17 ENCOUNTER — Ambulatory Visit (INDEPENDENT_AMBULATORY_CARE_PROVIDER_SITE_OTHER): Payer: Medicare PPO | Admitting: Family Medicine

## 2021-07-17 ENCOUNTER — Other Ambulatory Visit (INDEPENDENT_AMBULATORY_CARE_PROVIDER_SITE_OTHER): Payer: Medicare PPO

## 2021-07-17 ENCOUNTER — Encounter: Payer: Self-pay | Admitting: Family Medicine

## 2021-07-17 ENCOUNTER — Telehealth: Payer: Self-pay | Admitting: Radiology

## 2021-07-17 VITALS — BP 118/62 | HR 75 | Temp 98.5°F | Resp 16 | Ht 61.75 in | Wt 145.2 lb

## 2021-07-17 DIAGNOSIS — I10 Essential (primary) hypertension: Secondary | ICD-10-CM | POA: Diagnosis not present

## 2021-07-17 DIAGNOSIS — R7303 Prediabetes: Secondary | ICD-10-CM

## 2021-07-17 DIAGNOSIS — Z1211 Encounter for screening for malignant neoplasm of colon: Secondary | ICD-10-CM

## 2021-07-17 DIAGNOSIS — R0602 Shortness of breath: Secondary | ICD-10-CM | POA: Diagnosis not present

## 2021-07-17 DIAGNOSIS — E7800 Pure hypercholesterolemia, unspecified: Secondary | ICD-10-CM | POA: Diagnosis not present

## 2021-07-17 DIAGNOSIS — D649 Anemia, unspecified: Secondary | ICD-10-CM | POA: Diagnosis not present

## 2021-07-17 DIAGNOSIS — F3341 Major depressive disorder, recurrent, in partial remission: Secondary | ICD-10-CM

## 2021-07-17 DIAGNOSIS — Z0000 Encounter for general adult medical examination without abnormal findings: Secondary | ICD-10-CM | POA: Diagnosis not present

## 2021-07-17 LAB — CBC WITH DIFFERENTIAL/PLATELET
Basophils Absolute: 0.1 10*3/uL (ref 0.0–0.1)
Basophils Relative: 1 % (ref 0.0–3.0)
Eosinophils Absolute: 0.2 10*3/uL (ref 0.0–0.7)
Eosinophils Relative: 2 % (ref 0.0–5.0)
HCT: 24 % — ABNORMAL LOW (ref 36.0–46.0)
Hemoglobin: 7 g/dL — CL (ref 12.0–15.0)
Lymphocytes Relative: 9.2 % — ABNORMAL LOW (ref 12.0–46.0)
Lymphs Abs: 0.8 10*3/uL (ref 0.7–4.0)
MCHC: 29.2 g/dL — ABNORMAL LOW (ref 30.0–36.0)
MCV: 58.3 fl — ABNORMAL LOW (ref 78.0–100.0)
Monocytes Absolute: 0.7 10*3/uL (ref 0.1–1.0)
Monocytes Relative: 8.1 % (ref 3.0–12.0)
Neutro Abs: 6.9 10*3/uL (ref 1.4–7.7)
Neutrophils Relative %: 79.7 % — ABNORMAL HIGH (ref 43.0–77.0)
Platelets: 444 10*3/uL — ABNORMAL HIGH (ref 150.0–400.0)
RBC: 4.12 Mil/uL (ref 3.87–5.11)
RDW: 18.7 % — ABNORMAL HIGH (ref 11.5–15.5)
WBC: 8.7 10*3/uL (ref 4.0–10.5)

## 2021-07-17 LAB — T4, FREE: Free T4: 0.61 ng/dL (ref 0.60–1.60)

## 2021-07-17 LAB — IBC + FERRITIN
Ferritin: 2.3 ng/mL — ABNORMAL LOW (ref 10.0–291.0)
Iron: 15 ug/dL — ABNORMAL LOW (ref 42–145)
Saturation Ratios: 2.5 % — ABNORMAL LOW (ref 20.0–50.0)
TIBC: 604.8 ug/dL — ABNORMAL HIGH (ref 250.0–450.0)
Transferrin: 432 mg/dL — ABNORMAL HIGH (ref 212.0–360.0)

## 2021-07-17 LAB — TSH: TSH: 8.11 u[IU]/mL — ABNORMAL HIGH (ref 0.35–5.50)

## 2021-07-17 LAB — T3, FREE: T3, Free: 3.7 pg/mL (ref 2.3–4.2)

## 2021-07-17 LAB — BRAIN NATRIURETIC PEPTIDE: Pro B Natriuretic peptide (BNP): 36 pg/mL (ref 0.0–100.0)

## 2021-07-17 NOTE — Assessment & Plan Note (Signed)
Stable, chronic.  Continue current medication.  Cymbalta 60 mg daily 

## 2021-07-17 NOTE — Telephone Encounter (Signed)
[facility] lab called critical results, HGB - 7.0, HCT - 24.0, results given to Dr Diona Browner

## 2021-07-17 NOTE — Patient Instructions (Addendum)
Work on low [employer].  Please stop at the lab to have labs drawn.  Return cologuard as discussed.  Your EKG is unremarkable... we may consider an ECHO of your heart and further lung evaluation depending on lab results.   Please call the location of your choice from the menu below to schedule your Mammogram and/or Bone Density appointment.    [facility]                      Phone:  [phone], [place] [address]                                                             Services: Traditional and 3D Mammogram, [place] [facility] Density                 Phone: [phone] [address], [place] [address]    Service: Bone Density ONLY   *this site does NOT perform mammograms  [facility]                        Phone:  [phone] [address], [place] [address]                                            Services:  3D Mammogram and [place] at [facility]   Phone:  [phone]   [place],  [address]                                            Services: 3D Mammogram and [place]  [place] at [facility])  Phone:  [phone]   [address] Room  [place], [place] [address]                                              Services:  3D Mammogram and Bone Density

## 2021-07-17 NOTE — Telephone Encounter (Signed)
Patient contacted and results discussed in detail.  See result notes for plan.

## 2021-07-17 NOTE — Progress Notes (Signed)
Patient ID: Katie Dennis, female    DOB: May 31, 1955, 66 y.o.   MRN: [id number]  This visit was conducted in person.  BP 118/62   Pulse 75   Temp 98.5 F (36.9 C)   Resp 16   Ht 5' 1.75" (1.568 m)   Wt 145 lb 4 oz (65.9 kg)   SpO2 97%   BMI 26.78 kg/m    CC:  Chief Complaint  Patient presents with   Medicare Wellness    Subjective:   HPI: Katie Dennis is a 66 y.o. female presenting on 07/17/2021 for  Welcome to [facility]  The patient presents for  welcome to  medicare wellness, complete physical and review of chronic health problems. He/She also has the following acute concerns today: She is continuing to have SOB, ongoing for > 1 year, worsening over time.   Mainly with exertion, minimal at rest.  Occ mild cough and congestion. No wheezing.  No chest pain or pressure. No edema. Occ heart skips beats, no new.  History of mitral valve prolapse.   Hx of smoking,  20 year history   In past negative stress test age 89s   I have personally reviewed the Medicare Annual Wellness questionnaire and have noted 1. The patient's medical and social history 2. Their use of alcohol, tobacco or illicit drugs 3. Their current medications and supplements 4. The patient's functional ability including ADL's, fall risks, home safety risks and hearing or visual             impairment. 5. Diet and physical activities 6. Evidence for depression or mood disorders 7.         Updated provider list Cognitive evaluation was performed and recorded on pt medicare questionnaire form. The patients weight, height, BMI and visual acuity have been recorded in the chart   I have made referrals, counseling and provided education to the patient based review of the above and I have provided the pt with a written personalized care plan for preventive services.   Documentation of this information was scanned into the electronic record under the media tab.   Advance directives and end of life  planning reviewed in detail with patient and documented in EMR. Patient given handout on advance care directives if needed. HCPOA and living will updated if needed.  No falls in last 12 months.  Hearing Screening   '1000Hz'$  '2000Hz'$  '4000Hz'$  '5000Hz'$   Right ear Pass Pass Fail Pass  Left ear Pass Pass Pass Pass   Vision Screening   Right eye Left eye Both eyes  Without correction     With correction '20/20 20/25 20/40 '$     Elevated Cholesterol:  tolerable control on no medication. She has been working on low cholesterol. Lab Results  Component Value Date   CHOL 186 07/09/2021   HDL 47.60 07/09/2021   LDLCALC 117 (H) 07/09/2021   LDLDIRECT 153.4 11/15/2012   TRIG 110.0 07/09/2021   CHOLHDL 4 07/09/2021  The 10-year ASCVD risk score (Arnett DK, et al., 2019) is: 6.5%   Values used to calculate the score:     Age: 69 years     Sex: Female     Is Non-Hispanic African American: No     Diabetic: No     Tobacco smoker: No     Systolic Blood Pressure: 662 mmHg     Is BP treated: Yes     HDL Cholesterol: 47.6 mg/dL     Total Cholesterol: 186 mg/dL  Using medications without problems: Muscle aches:  Diet compliance: moderate Exercise: limited due to SOB and pelvic pain, back pain Other complaints:  Hypertension:   Well controlled on losartan 100 mg daily, HCTZ 25 mg daily  BP Readings from Last 3 Encounters:  07/17/21 118/62  04/02/20 122/80  03/28/19 (!) 166/100  Using medication without problems or lightheadedness:  Chest pain with exertion: none Edema:  occ at end of the day Short of breath: yes Average home BPs: Other issues:    Prediabetes  fasting glucose was 148.. had ice cream night before. Lab Results  Component Value Date   HGBA1C 5.9 07/09/2021      MDD :  Stable control on cymbalta  60 mg daily Flowsheet Row Office Visit from 07/17/2021 in [place] [facility] Total Score 1        Relevant past medical, surgical, family and social history  reviewed and updated as indicated. Interim medical history since our last visit reviewed. Allergies and medications reviewed and updated. Outpatient Medications Prior to Visit  Medication Sig Dispense Refill   cyclobenzaprine (FLEXERIL) 10 MG tablet TAKE 1 TO 2 TABLETS BY MOUTH DAILY AT BEDTIME 60 tablet 1   DULoxetine (CYMBALTA) 60 MG capsule TAKE 1 CAPSULE BY MOUTH EVERY DAY 90 capsule 0   hydrochlorothiazide (HYDRODIURIL) 25 MG tablet TAKE 1 TABLET BY MOUTH EVERY DAY 90 tablet 0   losartan (COZAAR) 100 MG tablet TAKE 1 TABLET BY MOUTH EVERY DAY 90 tablet 0   Melatonin 1 MG TABS Take 1 tablet by mouth at bedtime as needed.      senna (SENOKOT) 8.6 MG tablet Take 4 tablets by mouth daily.     traMADol (ULTRAM) 50 MG tablet TAKE 1 TABLET BY MOUTH EVERY MORNING AND 2 TABLETS BY MOUTH EVERY EVENING 90 tablet 0   No facility-administered medications prior to visit.     Per HPI unless specifically indicated in ROS section below Review of Systems Objective:  BP 118/62   Pulse 75   Temp 98.5 F (36.9 C)   Resp 16   Ht 5' 1.75" (1.568 m)   Wt 145 lb 4 oz (65.9 kg)   SpO2 97%   BMI 26.78 kg/m   Wt Readings from Last 3 Encounters:  07/17/21 145 lb 4 oz (65.9 kg)  04/02/20 149 lb (67.6 kg)  03/28/19 152 lb (68.9 kg)      Physical Exam    Results for orders placed or performed in visit on 07/09/21  Comprehensive metabolic panel  Result Value Ref Range   Sodium 137 135 - 145 mEq/L   Potassium 3.6 3.5 - 5.1 mEq/L   Chloride 102 96 - 112 mEq/L   CO2 24 19 - 32 mEq/L   Glucose, Bld 148 (H) 70 - 99 mg/dL   BUN 16 6 - 23 mg/dL   Creatinine, Ser 0.96 0.40 - 1.20 mg/dL   Total Bilirubin 0.4 0.2 - 1.2 mg/dL   Alkaline Phosphatase 83 39 - 117 U/L   AST 29 0 - 37 U/L   ALT 28 0 - 35 U/L   Total Protein 6.7 6.0 - 8.3 g/dL   Albumin 3.9 3.5 - 5.2 g/dL   GFR 62.06 >60.00 mL/min   Calcium 9.3 8.4 - 10.5 mg/dL  Lipid panel  Result Value Ref Range   Cholesterol 186 0 - 200 mg/dL    Triglycerides 110.0 0.0 - 149.0 mg/dL   HDL 47.60 >39.00 mg/dL   VLDL 22.0 0.0 -  40.0 mg/dL   LDL Cholesterol 117 (H) 0 - 99 mg/dL   Total CHOL/HDL Ratio 4    NonHDL 138.66   Hemoglobin A1c  Result Value Ref Range   Hgb A1c MFr Bld 5.9 4.6 - 6.5 %     COVID 19 screen:  No recent travel or known exposure to COVID19 The patient denies respiratory symptoms of COVID 19 at this time. The importance of social distancing was discussed today.   Assessment and Plan The patient's preventative maintenance and recommended screening tests for an annual wellness exam were reviewed in full today. Brought up to date unless services declined.  Counselled on the importance of diet, exercise, and its role in overall health and mortality. The patient's FH and SH was reviewed, including their home life, tobacco status, and drug and alcohol status.    2013 EKG baseline  Last pap 2018 nml, on q5 year schedule.  Plan last at age 19... due today.. she would like to  postpone given not feeling well.  No family history of ovarian  uterine cancer. Colon cancer screening:   ifob neg 05/2019,  will do COLOguard this year  mammogram 02/2018 repeat due now Vaccines: uptodate with Tdap, flu, COVID x 4, PNA 20, shingrix DEXA: 02/2018 osteopenia repeat  in 5 years Former smoker 25-35 pack year history, QUIT remotely 1996  Hep C: done  HIV: refused    Eliezer Lofts, MD

## 2021-07-24 ENCOUNTER — Other Ambulatory Visit (INDEPENDENT_AMBULATORY_CARE_PROVIDER_SITE_OTHER): Payer: Medicare PPO

## 2021-07-24 DIAGNOSIS — Z1211 Encounter for screening for malignant neoplasm of colon: Secondary | ICD-10-CM | POA: Diagnosis not present

## 2021-07-28 LAB — HEMOCCULT SLIDES (X 3 CARDS)
Fecal Occult Blood: NEGATIVE
OCCULT 1: NEGATIVE
OCCULT 2: NEGATIVE
OCCULT 3: NEGATIVE
OCCULT 4: NEGATIVE
OCCULT 5: NEGATIVE

## 2021-07-30 ENCOUNTER — Other Ambulatory Visit: Payer: Self-pay | Admitting: Family Medicine

## 2021-07-30 DIAGNOSIS — D509 Iron deficiency anemia, unspecified: Secondary | ICD-10-CM

## 2021-08-01 ENCOUNTER — Other Ambulatory Visit: Payer: Self-pay | Admitting: Family Medicine

## 2021-08-04 ENCOUNTER — Telehealth: Payer: Self-pay | Admitting: Family Medicine

## 2021-08-10 ENCOUNTER — Other Ambulatory Visit: Payer: Self-pay | Admitting: Family Medicine

## 2021-08-13 ENCOUNTER — Other Ambulatory Visit: Payer: Self-pay | Admitting: Family Medicine

## 2021-08-13 DIAGNOSIS — Z1231 Encounter for screening mammogram for malignant neoplasm of breast: Secondary | ICD-10-CM

## 2021-08-13 LAB — COLOGUARD: COLOGUARD: NEGATIVE

## 2021-08-14 ENCOUNTER — Encounter: Payer: Self-pay | Admitting: Family Medicine

## 2021-08-14 ENCOUNTER — Other Ambulatory Visit (HOSPITAL_COMMUNITY)
Admission: RE | Admit: 2021-08-14 | Discharge: 2021-08-14 | Disposition: A | Discharge status: Home / Self Care | Payer: Medicare PPO | Source: Ambulatory Visit | Attending: Family Medicine | Admitting: Family Medicine

## 2021-08-14 ENCOUNTER — Ambulatory Visit: Payer: Medicare PPO | Admitting: Family Medicine

## 2021-08-14 VITALS — BP 122/72 | HR 98 | Temp 98.8°F | Ht 61.75 in | Wt 143.4 lb

## 2021-08-14 DIAGNOSIS — Z1151 Encounter for screening for human papillomavirus (HPV): Secondary | ICD-10-CM | POA: Insufficient documentation

## 2021-08-14 DIAGNOSIS — Z01419 Encounter for gynecological examination (general) (routine) without abnormal findings: Secondary | ICD-10-CM | POA: Insufficient documentation

## 2021-08-14 DIAGNOSIS — Z124 Encounter for screening for malignant neoplasm of cervix: Secondary | ICD-10-CM | POA: Diagnosis not present

## 2021-08-14 NOTE — Progress Notes (Signed)
.  pap

## 2021-08-14 NOTE — Progress Notes (Signed)
Patient ID: Katie Dennis, female    DOB: 02/11/1955, 66 y.o.   MRN: [id number]  This visit was conducted in person.  BP 122/72   Pulse 98   Temp 98.8 F (37.1 C) (Oral)   Ht 5' 1.75" (1.568 m)   Wt 143 lb 6 oz (65 kg)   SpO2 96%   BMI 26.44 kg/m    CC:  Chief Complaint  Patient presents with   Gynecologic Exam    Pap Only    Subjective:   HPI: Katie Dennis is a 66 y.o. female presenting on 08/14/2021 for Gynecologic Exam (Pap Only)  She is doing well overall.  She had a recent physical exam but was unable to do her Pap smear at that time.  She returns today with no gynecological issues no abdominal pain.  No vaginal bleeding or discharge.      Relevant past medical, surgical, family and social history reviewed and updated as indicated. Interim medical history since our last visit reviewed. Allergies and medications reviewed and updated. Outpatient Medications Prior to Visit  Medication Sig Dispense Refill   cyclobenzaprine (FLEXERIL) 10 MG tablet TAKE 1 TO 2 TABLETS BY MOUTH DAILY AT BEDTIME 60 tablet 1   DULoxetine (CYMBALTA) 60 MG capsule TAKE 1 CAPSULE BY MOUTH EVERY DAY 90 capsule 1   hydrochlorothiazide (HYDRODIURIL) 25 MG tablet TAKE 1 TABLET BY MOUTH EVERY DAY 90 tablet 1   losartan (COZAAR) 100 MG tablet TAKE 1 TABLET BY MOUTH EVERY DAY 90 tablet 3   senna (SENOKOT) 8.6 MG tablet Take 4 tablets by mouth daily.     traMADol (ULTRAM) 50 MG tablet TAKE 1 TABLET BY MOUTH EVERY MORNING AND 2 TABLETS BY MOUTH EVERY EVENING 90 tablet 0   Melatonin 1 MG TABS Take 1 tablet by mouth at bedtime as needed.      No facility-administered medications prior to visit.     Per HPI unless specifically indicated in ROS section below Review of Systems  Constitutional:  Negative for fatigue and fever.  HENT:  Negative for ear pain.   Eyes:  Negative for pain.  Respiratory:  Negative for chest tightness and shortness of breath.   Cardiovascular:  Negative for chest pain,  palpitations and leg swelling.  Gastrointestinal:  Negative for abdominal pain.  Genitourinary:  Negative for dysuria.   Objective:  BP 122/72   Pulse 98   Temp 98.8 F (37.1 C) (Oral)   Ht 5' 1.75" (1.568 m)   Wt 143 lb 6 oz (65 kg)   SpO2 96%   BMI 26.44 kg/m   Wt Readings from Last 3 Encounters:  08/14/21 143 lb 6 oz (65 kg)  07/17/21 145 lb 4 oz (65.9 kg)  04/02/20 149 lb (67.6 kg)      Physical Exam Constitutional:      General: She is not in acute distress.    Appearance: Normal appearance. She is well-developed. She is not ill-appearing or toxic-appearing.  HENT:     Right Ear: Hearing normal.     Left Ear: Hearing normal.  Eyes:     General: Lids are normal. Lids are everted, no foreign bodies appreciated.  Neck:     Thyroid: No thyroid mass or thyromegaly.     Trachea: Trachea normal.  Cardiovascular:     Rate and Rhythm: Normal rate and regular rhythm.     Heart sounds: Normal heart sounds, S1 normal and S2 normal. No murmur heard.    No  gallop.  Pulmonary:     Effort: Pulmonary effort is normal. No respiratory distress.     Breath sounds: Normal breath sounds. No wheezing, rhonchi or rales.  Abdominal:     General: Bowel sounds are normal. There is no distension or abdominal bruit.     Palpations: Abdomen is soft. There is no fluid wave or mass.     Tenderness: There is no abdominal tenderness. There is no guarding or rebound.     Hernia: No hernia is present.  Genitourinary:    Exam position: Supine.     Labia:        Right: No rash, tenderness or lesion.        Left: No rash, tenderness or lesion.      Vagina: Normal.     Cervix: No cervical motion tenderness, discharge or friability.     Uterus: Not enlarged and not tender.      Adnexa:        Right: No mass, tenderness or fullness.         Left: No mass, tenderness or fullness.    Skin:    General: Skin is warm and dry.     Findings: No rash.  Neurological:     Mental Status: She is alert.      Cranial Nerves: No cranial nerve deficit.     Sensory: No sensory deficit.  Psychiatric:        Mood and Affect: Mood is not anxious or depressed.        Speech: Speech normal.        Behavior: Behavior normal. Behavior is cooperative.        Judgment: Judgment normal.       Results for orders placed or performed in visit on 07/24/21  Hemoccult Cards (X3 cards)  Result Value Ref Range   OCCULT 1 Negative Negative   OCCULT 2 Negative Negative   OCCULT 3 Negative Negative   OCCULT 4 Negative Negative   OCCULT 5 Negative Negative   Fecal Occult Blood Negative Negative     COVID 19 screen:  No recent travel or known exposure to [place] The patient denies respiratory symptoms of COVID 19 at this time. The importance of social distancing was discussed today.   Assessment and Plan  She is doing well overall.  She has no new issues.  She recently had a complete physical exam but was unable to do her Pap smear at that time.  Vaginal exam was performed today with digital vaginal exam and Pap smear cervical cancer testing.  Eliezer Lofts, MD

## 2021-08-18 LAB — CYTOLOGY - PAP
Comment: NEGATIVE
Diagnosis: NEGATIVE
High risk HPV: NEGATIVE

## 2021-08-20 ENCOUNTER — Other Ambulatory Visit: Payer: Self-pay | Admitting: Family Medicine

## 2021-08-20 NOTE — Telephone Encounter (Signed)
Last office visit 08/14/21 for pap smear.  Last refilled 06/23/21 for #60 with 1 refill.  No future appointments.

## 2021-09-04 ENCOUNTER — Ambulatory Visit
Admission: RE | Admit: 2021-09-04 | Discharge: 2021-09-04 | Disposition: A | Discharge status: Home / Self Care | Payer: Medicare PPO | Source: Ambulatory Visit | Attending: Family Medicine | Admitting: Family Medicine

## 2021-09-04 DIAGNOSIS — Z1231 Encounter for screening mammogram for malignant neoplasm of breast: Secondary | ICD-10-CM | POA: Diagnosis present

## 2021-09-05 ENCOUNTER — Other Ambulatory Visit: Payer: Self-pay | Admitting: Family Medicine

## 2021-09-05 DIAGNOSIS — R928 Other abnormal and inconclusive findings on diagnostic imaging of breast: Secondary | ICD-10-CM

## 2021-09-05 DIAGNOSIS — N6489 Other specified disorders of breast: Secondary | ICD-10-CM

## 2021-09-09 ENCOUNTER — Ambulatory Visit
Admission: RE | Admit: 2021-09-09 | Discharge: 2021-09-09 | Disposition: A | Discharge status: Home / Self Care | Payer: Medicare PPO | Source: Ambulatory Visit | Attending: Family Medicine | Admitting: Family Medicine

## 2021-09-09 DIAGNOSIS — N6489 Other specified disorders of breast: Secondary | ICD-10-CM | POA: Insufficient documentation

## 2021-09-09 DIAGNOSIS — R928 Other abnormal and inconclusive findings on diagnostic imaging of breast: Secondary | ICD-10-CM

## 2021-09-10 ENCOUNTER — Other Ambulatory Visit: Payer: Self-pay | Admitting: Family Medicine

## 2021-09-10 DIAGNOSIS — R928 Other abnormal and inconclusive findings on diagnostic imaging of breast: Secondary | ICD-10-CM

## 2021-09-10 DIAGNOSIS — R599 Enlarged lymph nodes, unspecified: Secondary | ICD-10-CM

## 2021-09-10 DIAGNOSIS — N63 Unspecified lump in unspecified breast: Secondary | ICD-10-CM

## 2021-09-24 ENCOUNTER — Other Ambulatory Visit: Payer: Self-pay | Admitting: Family Medicine

## 2021-09-24 NOTE — Telephone Encounter (Signed)
Refill request Cyclobenzaprine Last refill 08/20/21 #60/1 Last office visit 08/14/21

## 2021-09-25 ENCOUNTER — Other Ambulatory Visit: Payer: Medicare PPO

## 2021-09-25 ENCOUNTER — Ambulatory Visit
Admission: RE | Admit: 2021-09-25 | Discharge: 2021-09-25 | Disposition: A | Discharge status: Home / Self Care | Payer: Medicare PPO | Source: Ambulatory Visit | Attending: Family Medicine | Admitting: Family Medicine

## 2021-09-25 DIAGNOSIS — R928 Other abnormal and inconclusive findings on diagnostic imaging of breast: Secondary | ICD-10-CM | POA: Insufficient documentation

## 2021-09-25 DIAGNOSIS — C50412 Malignant neoplasm of upper-outer quadrant of left female breast: Secondary | ICD-10-CM | POA: Diagnosis not present

## 2021-09-25 DIAGNOSIS — R590 Localized enlarged lymph nodes: Secondary | ICD-10-CM | POA: Diagnosis not present

## 2021-09-25 DIAGNOSIS — N6321 Unspecified lump in the left breast, upper outer quadrant: Secondary | ICD-10-CM | POA: Insufficient documentation

## 2021-09-25 DIAGNOSIS — R599 Enlarged lymph nodes, unspecified: Secondary | ICD-10-CM

## 2021-09-25 DIAGNOSIS — N63 Unspecified lump in unspecified breast: Secondary | ICD-10-CM

## 2021-09-26 ENCOUNTER — Encounter: Payer: Self-pay | Admitting: Family Medicine

## 2021-09-26 ENCOUNTER — Encounter: Payer: Self-pay | Admitting: *Deleted

## 2021-09-26 LAB — SURGICAL PATHOLOGY

## 2021-09-26 NOTE — Progress Notes (Signed)
Received referral for newly diagnosed breast cancer from [facility].  Navigation initiated.  Gave [name] the names of general surgeons in [place] along with the names of the medical oncologists here at the cancer center.   I will call her Monday afternoon to see who she would like to see.

## 2021-09-29 ENCOUNTER — Encounter: Payer: Self-pay | Admitting: *Deleted

## 2021-09-29 DIAGNOSIS — C50919 Malignant neoplasm of unspecified site of unspecified female breast: Secondary | ICD-10-CM

## 2021-09-30 NOTE — Progress Notes (Signed)
Katie Dennis has been scheduled with Dr. Darrall Dears on 8/24 at 10:30 and Dr. Dahlia Byes 8/28 at 4:00.

## 2021-10-01 DIAGNOSIS — C50912 Malignant neoplasm of unspecified site of left female breast: Secondary | ICD-10-CM | POA: Insufficient documentation

## 2021-10-02 ENCOUNTER — Inpatient Hospital Stay: Payer: Medicare PPO

## 2021-10-02 ENCOUNTER — Encounter: Payer: Self-pay | Admitting: *Deleted

## 2021-10-02 ENCOUNTER — Inpatient Hospital Stay: Payer: Medicare PPO | Attending: Internal Medicine | Admitting: Internal Medicine

## 2021-10-02 ENCOUNTER — Encounter: Payer: Self-pay | Admitting: Internal Medicine

## 2021-10-02 DIAGNOSIS — Z170 Estrogen receptor positive status [ER+]: Secondary | ICD-10-CM | POA: Diagnosis not present

## 2021-10-02 DIAGNOSIS — C50912 Malignant neoplasm of unspecified site of left female breast: Secondary | ICD-10-CM | POA: Diagnosis not present

## 2021-10-02 DIAGNOSIS — C50412 Malignant neoplasm of upper-outer quadrant of left female breast: Secondary | ICD-10-CM | POA: Insufficient documentation

## 2021-10-02 DIAGNOSIS — Z79899 Other long term (current) drug therapy: Secondary | ICD-10-CM | POA: Insufficient documentation

## 2021-10-02 DIAGNOSIS — Z803 Family history of malignant neoplasm of breast: Secondary | ICD-10-CM | POA: Diagnosis not present

## 2021-10-02 DIAGNOSIS — D509 Iron deficiency anemia, unspecified: Secondary | ICD-10-CM | POA: Insufficient documentation

## 2021-10-02 DIAGNOSIS — Z87891 Personal history of nicotine dependence: Secondary | ICD-10-CM | POA: Diagnosis not present

## 2021-10-02 LAB — CBC WITH DIFFERENTIAL/PLATELET
Abs Immature Granulocytes: 0.1 10*3/uL — ABNORMAL HIGH (ref 0.00–0.07)
Basophils Absolute: 0.1 10*3/uL (ref 0.0–0.1)
Basophils Relative: 1 %
Eosinophils Absolute: 0.4 10*3/uL (ref 0.0–0.5)
Eosinophils Relative: 5 %
HCT: 45.4 % (ref 36.0–46.0)
Hemoglobin: 15.5 g/dL — ABNORMAL HIGH (ref 12.0–15.0)
Immature Granulocytes: 1 %
Lymphocytes Relative: 14 %
Lymphs Abs: 1.2 10*3/uL (ref 0.7–4.0)
MCH: 28.3 pg (ref 26.0–34.0)
MCHC: 34.1 g/dL (ref 30.0–36.0)
MCV: 83 fL (ref 80.0–100.0)
Monocytes Absolute: 0.6 10*3/uL (ref 0.1–1.0)
Monocytes Relative: 7 %
Neutro Abs: 6 10*3/uL (ref 1.7–7.7)
Neutrophils Relative %: 72 %
Platelets: 267 10*3/uL (ref 150–400)
RBC: 5.47 MIL/uL — ABNORMAL HIGH (ref 3.87–5.11)
RDW: 16.9 % — ABNORMAL HIGH (ref 11.5–15.5)
WBC: 8.4 10*3/uL (ref 4.0–10.5)
nRBC: 0 % (ref 0.0–0.2)

## 2021-10-02 LAB — IRON AND TIBC
Iron: 81 ug/dL (ref 28–170)
Saturation Ratios: 21 % (ref 10.4–31.8)
TIBC: 379 ug/dL (ref 250–450)
UIBC: 298 ug/dL

## 2021-10-02 LAB — FERRITIN: Ferritin: 66 ng/mL (ref 11–307)

## 2021-10-02 NOTE — Progress Notes (Signed)
Accompanied patient and husband to initial medical oncology appointment.   Reviewed Breast Cancer treatment handbook.   Care plan summary given to patient.   Reviewed outreach programs and cancer center services.  

## 2021-10-02 NOTE — Progress Notes (Signed)
[place] CONSULT NOTE  Patient Care Team: Jinny Sanders, MD as PCP - General (Family Medicine) Daiva Huge, RN as Oncology Nurse Navigator  REFERRING PROVIDER: Dr. Diona Browner  REASON FOR REFFERAL: new diagnosis of left breast IDC  CANCER STAGING   Cancer Staging  Invasive ductal carcinoma of left breast [facility]) Staging form: Breast, AJCC 8th Edition - Clinical stage from 09/25/2021: Stage IA (cT1b, cN0(f), cM0, G2, ER+, PR+, HER2-) - Signed by Jane Canary, MD on 10/01/2021 Stage prefix: Initial diagnosis Method of lymph node assessment: Core biopsy Nuclear grade: G2 Mitotic count score: Score 1 Histologic grading system: 3 grade system   ASSESSMENT & PLAN:  Katie Dennis 66 y.o. female with past medical history of pelvic fractures from [place] status post traction was referred to oncology clinic for further management of newly diagnosed left breast cancer.  #Left breast invasive ductal cancer, ER/PR+, HER2-, Stage 1A -Detected on screening mammogram.  Status post left breast biopsy on 09/25/2021. -Discussed in detail about the cancer diagnosis, prognosis and treatment options. The first step in the management is surgery which entails lumpectomy versus mastectomy.  Patient is scheduled to see Dr.Pabon on 10/06/2021 to further discuss.  Depending patient's decision, we will have radiation oncology consult.  The tumor is more than 5 mm in size so we will send for Oncotype testing to help [place] make decision about chemotherapy.  We discussed about aromatase inhibitors which include letrozole, anastrozole and exemestane.  Duration of treatment will likely be 5 years.  Side effects were discussed including but not limited to myalgias, arthralgias, mood changes, hot flashes, thinning of bone.  Advised to take calcium and vitamin D supplements.  Encouraged weightbearing exercises to help with bone health.  Her last DEXA scan was in 2020 which showed osteopenia.  Repeat DEXA scan this  year.  #Family history of breast cancer -Offered genetic testing.  Patient will think about it and will let us know.  #Iron deficiency anemia -Labs reviewed from June 2023.  Hemoglobin 7.  Ferritin of 2.   - Cologuard was negative.  Patient has never had colonoscopy. -She is on oral iron for couple of months now.  We will repeat CBC and iron panel today.  If there is no improvement in the iron stores with oral iron, I discussed about doing IV iron.  Low but a potential risk of anaphylactic reaction also discussed.  -I also discussed about evaluating for the source of bleeding with colonoscopy and endoscopy.  She needs time to think about it.  She will be following up with her primary next month also.  Orders Placed This Encounter  Procedures   CBC with Differential    Standing Status:   Future    Number of Occurrences:   1    Standing Expiration Date:   10/02/2022   Iron and TIBC([facility])    Standing Status:   Future    Number of Occurrences:   1    Standing Expiration Date:   10/03/2022   Ferritin    Standing Status:   Future    Number of Occurrences:   1    Standing Expiration Date:   10/03/2022   RTC to see me in 2 weeks after the surgery.   The total time spent in the appointment was 60 minutes encounter with patients including review of chart and various tests results, discussions about plan of care and coordination of care plan   All questions were answered. The  patient knows to call the clinic with any problems, questions or concerns. No barriers to learning was detected.  Jane Canary, MD 8/24/202311:41 AM  CHIEF COMPLAINTS/PURPOSE OF CONSULTATION:  Left breast cancer  HISTORY OF PRESENTING ILLNESS:  Katie Dennis 66 y.o. female with past medical history of pelvic fractures from [place] status post traction was referred to oncology clinic for further management of newly diagnosed left breast cancer. Patient reported she has been delaying her mammogram however  recently she noticed some nodule and bruising on the left side of the breast which prompted her to get a screening mammogram.  Patient denies fever, chills, nausea, vomiting, shortness of breath, cough, abdominal pain, bleeding, bowel or bladder issues. Energy level is good.  Appetite is good.  Denies any weight loss. Denies pain.   I have reviewed her chart and materials related to her cancer extensively and collaborated history with the patient. Summary of oncologic history is as follows: Oncology History  Invasive ductal carcinoma of left breast ([place])  09/04/2021 Mammogram   Screening mammogram FINDINGS: In the left breast, a possible asymmetry warrants further evaluation. In the right breast, no findings suspicious for malignancy.  Diagnostic mammogram and targeted US 09/09/2021 Targeted ultrasound was performed of the LEFT upper outer breast. At 1:30 8 cm from nipple, there is an irregular hypoechoic mass with irregular margins. It measures 5 x 4 x 5 mm. This is favored to correspond to the site of screening mammographic concern.   There is a mildly enlarged LEFT axillary lymph node demonstrating cortical thickness of 4 mm.     09/25/2021 Initial Biopsy   Site A. BREAST MASS, LEFT 1:30 8 CM FN; ULTRASOUND-GUIDED BIOPSY: - INVASIVE MAMMARY CARCINOMA, NO SPECIAL TYPE.  Size of invasive carcinoma: 7 mm in this sample  Histologic grade of invasive carcinoma: Grade 2                       Glandular/tubular differentiation score: 3                       Nuclear pleomorphism score: 2                       Mitotic rate score: 1                       Total score: 6  Ductal carcinoma in situ: Not identified  Lymphovascular invasion: Not identified    Site B. LYMPH NODE, LEFT AXILLA; ULTRASOUND-GUIDED BIOPSY: - CHANGES CONSISTENT WITH DERMATOPATHIC LYMPHADENITIS. - NEGATIVE FOR MALIGNANCY.  ER >90% positive  PR >90% positive HER2 negative Ki67 not performed.      09/25/2021 Cancer  Staging   Staging form: Breast, AJCC 8th Edition - Clinical stage from 09/25/2021: Stage IA (cT1b, cN0(f), cM0, G2, ER+, PR+, HER2-) - Signed by Jane Canary, MD on 10/01/2021 Stage prefix: Initial diagnosis Method of lymph node assessment: Core biopsy Nuclear grade: G2 Mitotic count score: Score 1 Histologic grading system: 3 grade system    Age of menarche 50 She has 1 daughter.  Age at first birth 65 No use of HRT Age 57 Family history of inflammatory breast cancer in maternal aunt and maternal cousin.   MEDICAL HISTORY:  Past Medical History:  Diagnosis Date   Allergic rhinitis    Pelvis fracture (HCC) 1992   traction x 1 month, L.L. 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  Tobacco Use   Smoking status: Former    Years: 30.00    Types: Cigarettes    Quit date: 02/10/1995    Years since quitting: 26.6   Smokeless tobacco: Never  Substance and Sexual Activity   Alcohol use: No    Alcohol/week: 0.0 standard drinks of alcohol   Drug use: No   Sexual activity: Not on file  Other Topics Concern   Not on file  Social History Narrative   Regular exercise- [employer]    Diet: vegetarian, water, healthy   Social Determinants of Health   Financial Resource Strain: Not on file  Food Insecurity: Not on file  Transportation Needs: Not on file  Physical Activity: Not on file  Stress: Not on file  Social Connections: Not on file  Intimate Partner Violence: Not on file    FAMILY HISTORY: Family History  Problem Relation Age of Onset   Hypertension Mother    Dementia Mother    Meniere's  disease Father    Coronary artery disease Maternal Grandfather    Hypertension Maternal Grandfather    Breast cancer Maternal Aunt    Diabetes Neg Hx     ALLERGIES:  has No Known Allergies.  MEDICATIONS:  Current Outpatient Medications  Medication Sig Dispense Refill   cyclobenzaprine (FLEXERIL) 10 MG tablet TAKE 1 TO 2 TABLETS BY MOUTH DAILY AT BEDTIME 60 tablet 1   DULoxetine (CYMBALTA) 60 MG capsule TAKE 1 CAPSULE BY MOUTH EVERY DAY 90 capsule 1   hydrochlorothiazide (HYDRODIURIL) 25 MG tablet TAKE 1 TABLET BY MOUTH EVERY DAY 90 tablet 1   losartan (COZAAR) 100 MG tablet TAKE 1 TABLET BY MOUTH EVERY DAY 90 tablet 3   senna (SENOKOT) 8.6 MG tablet Take 4 tablets by mouth daily.     traMADol (ULTRAM) 50 MG tablet TAKE 1 TABLET BY MOUTH EVERY MORNING AND 2 TABLETS BY MOUTH EVERY EVENING 90 tablet 0   No current facility-administered medications for this visit.    REVIEW OF SYSTEMS:   Pertinent information mentioned in HPI All other systems were reviewed with the patient and are negative.  PHYSICAL EXAMINATION: ECOG PERFORMANCE STATUS: 0 - Asymptomatic  Vitals:   10/02/21 1040  BP: 105/68  Pulse: 97  Resp: 16  Temp: 98.6 F (37 C)  SpO2: 97%   Filed Weights   10/02/21 1040  Weight: 148 lb (67.1 kg)    GENERAL:alert, no distress and comfortable SKIN: skin color, texture, turgor are normal, no rashes or significant lesions EYES: normal, conjunctiva are pink and non-injected, sclera clear OROPHARYNX:no exudate, no erythema and lips, buccal mucosa, and tongue normal  NECK: supple, thyroid normal size, non-tender, without nodularity LYMPH:  no palpable lymphadenopathy in the cervical, axillary or inguinal LUNGS: clear to auscultation and percussion with normal breathing effort HEART: regular rate & rhythm and no murmurs and no lower extremity edema ABDOMEN:abdomen soft, non-tender and normal bowel sounds Musculoskeletal:no cyanosis of digits and no clubbing  PSYCH:  alert & oriented x 3 with fluent speech NEURO:  no focal motor/sensory deficits  Physical Exam Chest:  Breasts:    Right: Normal.     Left: No swelling, bleeding, inverted nipple, mass, nipple discharge or tenderness.     Comments: Biopsy site clean. Tape removed.     LABORATORY DATA:  I have reviewed the data as listed Lab Results  Component Value Date   WBC 8.4 10/02/2021   HGB 15.5 (H) 10/02/2021   HCT 45.4 10/02/2021   MCV 83.0 10/02/2021   PLT 267 10/02/2021   Recent Labs    07/09/21 0954  NA 137  K 3.6  CL 102  CO2 24  GLUCOSE 148*  BUN 16  CREATININE 0.96  CALCIUM 9.3  PROT 6.7  ALBUMIN 3.9  AST 29  ALT 28  ALKPHOS 83  BILITOT 0.4    RADIOGRAPHIC STUDIES: I have personally reviewed the radiological images as listed and agreed with the findings in the report. [place] LT BREAST BX W LOC DEV 1ST LESION IMG BX SPEC US GUIDE  Addendum Date: 09/26/2021   ADDENDUM REPORT: 09/26/2021 14:26 ADDENDUM: PATHOLOGY revealed: Site A. BREAST MASS, LEFT 1:30 8 CM FN; ULTRASOUND-GUIDED BIOPSY: - INVASIVE MAMMARY CARCINOMA, NO SPECIAL TYPE. 7 mm in this sample. Grade 2. Ductal carcinoma in situ: Not identified. Lymphovascular invasion: Not identified. Pathology results are CONCORDANT with imaging findings, per Dr. Abelardo Diesel. PATHOLOGY revealed: Site B. LYMPH NODE, LEFT AXILLA; ULTRASOUND-GUIDED BIOPSY: - CHANGES CONSISTENT WITH DERMATOPATHIC LYMPHADENITIS. - NEGATIVE FOR MALIGNANCY. Pathology results are CONCORDANT with imaging findings, per Dr. Abelardo Diesel. Pathology results and recommendations below were discussed with patient by telephone on 09/26/2021. Patient reported biopsy site within normal limits with slight tenderness at the site. Post biopsy care instructions were reviewed, questions were answered and my direct phone number was provided to patient. Patient was instructed to call [facility] if any concerns or questions arise related to the biopsy. RECOMMENDATIONS:  Surgical consultation. Request for surgical consultation relayed to Casper Harrison RN at [facility] by Electa Sniff RN on 09/26/2021. Pathology results reported by Electa Sniff RN on 09/26/2021. Electronically Signed   By: Abelardo Diesel M.D.   On: 09/26/2021 14:26   Result Date: 09/26/2021 CLINICAL DATA:  Abnormal left breast mass and left axillary lymph node for biopsy EXAM: ULTRASOUND GUIDED LEFT BREAST CORE NEEDLE BIOPSY COMPARISON:  Previous exam(s). PROCEDURE: I met with the patient and we discussed the procedure of ultrasound-guided biopsy, including benefits and alternatives. We discussed the high likelihood of a successful procedure. We discussed the risks of the procedure, including infection, bleeding, tissue injury, clip migration, and inadequate sampling. Informed written consent was given. The usual time-out protocol was performed immediately prior to the procedure. 1: Lesion quadrant: Upper-outer quadrant Using sterile technique and 1% Lidocaine as local anesthetic, under direct ultrasound visualization, a 12 gauge spring-loaded device was used to perform biopsy of mass at left breast 1:30 o'clock using a lateral approach. At the conclusion of the procedure ribbon shaped tissue marker clip was deployed into the biopsy cavity. Follow up 2 view mammogram was performed and dictated separately. 2: Lesion quadrant: Abnormal left axillary lymph node Using sterile technique and 1% Lidocaine as local anesthetic, under direct ultrasound visualization, a 14 gauge spring-loaded device was used to perform biopsy of abnormal left axillary lymph node using a lateral approach. At the conclusion of the procedure [facility] shaped tissue marker clip was deployed into the biopsy cavity. Follow up 2 view mammogram was performed and dictated separately. IMPRESSION: Ultrasound guided  biopsies of left breast and left axilla. No apparent complications. Electronically Signed: By: Abelardo Diesel M.D. On: 09/25/2021  08:53   [place] AXILLARY NODE CORE BIOPSY LEFT  Addendum Date: 09/26/2021   ADDENDUM REPORT: 09/26/2021 14:26 ADDENDUM: PATHOLOGY revealed: Site A. BREAST MASS, LEFT 1:30 8 CM FN; ULTRASOUND-GUIDED BIOPSY: - INVASIVE MAMMARY CARCINOMA, NO SPECIAL TYPE. 7 mm in this sample. Grade 2. Ductal carcinoma in situ: Not identified. Lymphovascular invasion: Not identified. Pathology results are CONCORDANT with imaging findings, per Dr. Abelardo Diesel. PATHOLOGY revealed: Site B. LYMPH NODE, LEFT AXILLA; ULTRASOUND-GUIDED BIOPSY: - CHANGES CONSISTENT WITH DERMATOPATHIC LYMPHADENITIS. - NEGATIVE FOR MALIGNANCY. Pathology results are CONCORDANT with imaging findings, per Dr. Abelardo Diesel. Pathology results and recommendations below were discussed with patient by telephone on 09/26/2021. Patient reported biopsy site within normal limits with slight tenderness at the site. Post biopsy care instructions were reviewed, questions were answered and my direct phone number was provided to patient. Patient was instructed to call [facility] if any concerns or questions arise related to the biopsy. RECOMMENDATIONS: Surgical consultation. Request for surgical consultation relayed to Casper Harrison RN at [facility] by Electa Sniff RN on 09/26/2021. Pathology results reported by Electa Sniff RN on 09/26/2021. Electronically Signed   By: Abelardo Diesel M.D.   On: 09/26/2021 14:26   Result Date: 09/26/2021 CLINICAL DATA:  Abnormal left breast mass and left axillary lymph node for biopsy EXAM: ULTRASOUND GUIDED LEFT BREAST CORE NEEDLE BIOPSY COMPARISON:  Previous exam(s). PROCEDURE: I met with the patient and we discussed the procedure of ultrasound-guided biopsy, including benefits and alternatives. We discussed the high likelihood of a successful procedure. We discussed the risks of the procedure, including infection, bleeding, tissue injury, clip migration, and inadequate sampling. Informed written consent was given. The  usual time-out protocol was performed immediately prior to the procedure. 1: Lesion quadrant: Upper-outer quadrant Using sterile technique and 1% Lidocaine as local anesthetic, under direct ultrasound visualization, a 12 gauge spring-loaded device was used to perform biopsy of mass at left breast 1:30 o'clock using a lateral approach. At the conclusion of the procedure ribbon shaped tissue marker clip was deployed into the biopsy cavity. Follow up 2 view mammogram was performed and dictated separately. 2: Lesion quadrant: Abnormal left axillary lymph node Using sterile technique and 1% Lidocaine as local anesthetic, under direct ultrasound visualization, a 14 gauge spring-loaded device was used to perform biopsy of abnormal left axillary lymph node using a lateral approach. At the conclusion of the procedure [facility] shaped tissue marker clip was deployed into the biopsy cavity. Follow up 2 view mammogram was performed and dictated separately. IMPRESSION: Ultrasound guided biopsies of left breast and left axilla. No apparent complications. Electronically Signed: By: Abelardo Diesel M.D. On: 09/25/2021 08:53   MM CLIP PLACEMENT LEFT  Result Date: 09/25/2021 CLINICAL DATA:  Status post ultrasound-guided core biopsies of left breast mass and left axillary lymph. EXAM: 3D DIAGNOSTIC LEFT MAMMOGRAM POST ULTRASOUND BIOPSY COMPARISON:  Previous exam(s). FINDINGS: 3D Mammographic images were obtained following ultrasound guided biopsy of mass at left breast 1:30 o'clock. The biopsy marking clip is in expected position at the site of biopsy. 3D Mammographic images were obtained following ultrasound guided biopsy of abnormal left axillary lymph node. The biopsy marking clip is in expected position at the site of biopsy. IMPRESSION: Appropriate positioning of the ribbon shaped biopsy marking clip at the site of biopsy in the expected location of concern mass at left breast 1:30 o'clock. Appropriate  positioning of the  [facility] shaped biopsy marking clip at the site of biopsy in the expected location of concern. Final Assessment: Post Procedure Mammograms for Marker Placement Electronically Signed   By: Abelardo Diesel M.D.   On: 09/25/2021 09:07  MM DIAG BREAST TOMO UNI LEFT  Result Date: 09/09/2021 CLINICAL DATA:  Callback for LEFT breast asymmetry EXAM: DIGITAL DIAGNOSTIC UNILATERAL LEFT MAMMOGRAM WITH TOMOSYNTHESIS; ULTRASOUND LEFT BREAST LIMITED TECHNIQUE: Left digital diagnostic mammography and breast tomosynthesis was performed.; Targeted ultrasound examination of the left breast was performed. COMPARISON:  Previous exam(s). ACR Breast Density Category b: There are scattered areas of fibroglandular density. FINDINGS: Spot compression tomosynthesis views confirm persistence of architectural distortion in the LEFT upper outer breast posterior depth. It is best seen on MLO slice 39, spot CC slice 31 and ML slice 44 On physical exam, no discrete mass is appreciated. Targeted ultrasound was performed of the LEFT upper outer breast. At 1:30 8 cm from nipple, there is an irregular hypoechoic mass with irregular margins. It measures 5 x 4 x 5 mm. This is favored to correspond to the site of screening mammographic concern. Targeted ultrasound was performed of the LEFT axilla. There is a single mildly enlarged LEFT axillary lymph node with a cortical thickness of 4 mm and a preserved echogenic hila. Additional LEFT axillary lymph nodes demonstrate a cortical thickness of approximately 3 mm. This is similar in comparison to the contralateral axilla. Patient denies any confirmed diagnosis of autoimmune disorder but reports a history of fibromyalgia and a strong family history of autoimmune disorders. IMPRESSION: 1. A 5 mm mass in the LEFT upper outer breast is indeterminate but concerning for malignancy. Recommend ultrasound-guided biopsy for definitive characterization. Recommend attention on post marker placement mammogram to  assess for mammographic/sonographic correlation. 2. There is a mildly enlarged LEFT axillary lymph node demonstrating cortical thickness of 4 mm. Recommend ultrasound-guided biopsy for definitive characterization. RECOMMENDATION: LEFT breast ultrasound-guided biopsy x1 and LEFT axillary ultrasound-guided biopsy x1 I have discussed the findings and recommendations with the patient. The biopsy procedure was discussed with the patient and questions were answered. Patient expressed their understanding of the biopsy recommendation. Patient will be scheduled for biopsy at her earliest convenience by the schedulers. Ordering provider will be notified. If applicable, a reminder letter will be sent to the patient regarding the next appointment. BI-RADS CATEGORY  4: Suspicious. Electronically Signed   By: Valentino Saxon M.D.   On: 09/09/2021 11:26  US BREAST LTD UNI LEFT INC AXILLA  Result Date: 09/09/2021 CLINICAL DATA:  Callback for LEFT breast asymmetry EXAM: DIGITAL DIAGNOSTIC UNILATERAL LEFT MAMMOGRAM WITH TOMOSYNTHESIS; ULTRASOUND LEFT BREAST LIMITED TECHNIQUE: Left digital diagnostic mammography and breast tomosynthesis was performed.; Targeted ultrasound examination of the left breast was performed. COMPARISON:  Previous exam(s). ACR Breast Density Category b: There are scattered areas of fibroglandular density. FINDINGS: Spot compression tomosynthesis views confirm persistence of architectural distortion in the LEFT upper outer breast posterior depth. It is best seen on MLO slice 39, spot CC slice 31 and ML slice 44 On physical exam, no discrete mass is appreciated. Targeted ultrasound was performed of the LEFT upper outer breast. At 1:30 8 cm from nipple, there is an irregular hypoechoic mass with irregular margins. It measures 5 x 4 x 5 mm. This is favored to correspond to the site of screening mammographic concern. Targeted ultrasound was performed of the LEFT axilla. There is a single mildly enlarged LEFT  axillary lymph node with a cortical thickness of  4 mm and a preserved echogenic hila. Additional LEFT axillary lymph nodes demonstrate a cortical thickness of approximately 3 mm. This is similar in comparison to the contralateral axilla. Patient denies any confirmed diagnosis of autoimmune disorder but reports a history of fibromyalgia and a strong family history of autoimmune disorders. IMPRESSION: 1. A 5 mm mass in the LEFT upper outer breast is indeterminate but concerning for malignancy. Recommend ultrasound-guided biopsy for definitive characterization. Recommend attention on post marker placement mammogram to assess for mammographic/sonographic correlation. 2. There is a mildly enlarged LEFT axillary lymph node demonstrating cortical thickness of 4 mm. Recommend ultrasound-guided biopsy for definitive characterization. RECOMMENDATION: LEFT breast ultrasound-guided biopsy x1 and LEFT axillary ultrasound-guided biopsy x1 I have discussed the findings and recommendations with the patient. The biopsy procedure was discussed with the patient and questions were answered. Patient expressed their understanding of the biopsy recommendation. Patient will be scheduled for biopsy at her earliest convenience by the schedulers. Ordering provider will be notified. If applicable, a reminder letter will be sent to the patient regarding the next appointment. BI-RADS CATEGORY  4: Suspicious. Electronically Signed   By: Valentino Saxon M.D.   On: 09/09/2021 11:26  MM 3D SCREEN BREAST BILATERAL  Result Date: 09/05/2021 CLINICAL DATA:  Screening. EXAM: DIGITAL SCREENING BILATERAL MAMMOGRAM WITH TOMOSYNTHESIS AND CAD TECHNIQUE: Bilateral screening digital craniocaudal and mediolateral oblique mammograms were obtained. Bilateral screening digital breast tomosynthesis was performed. The images were evaluated with computer-aided detection. COMPARISON:  Previous exam(s). ACR Breast Density Category b: There are scattered areas of  fibroglandular density. FINDINGS: In the left breast, a possible asymmetry warrants further evaluation. In the right breast, no findings suspicious for malignancy. IMPRESSION: Further evaluation is suggested for possible asymmetry in the left breast. RECOMMENDATION: Diagnostic mammogram and possibly ultrasound of the left breast. (Code:[id number]-[id number]) The patient will be contacted regarding the findings, and additional imaging will be scheduled. BI-RADS CATEGORY  0: Incomplete. Need additional imaging evaluation and/or prior mammograms for comparison. Electronically Signed   By: Fidela Salisbury M.D.   On: 09/05/2021 15:15

## 2021-10-06 ENCOUNTER — Ambulatory Visit: Payer: Medicare PPO | Admitting: Surgery

## 2021-10-09 DIAGNOSIS — U071 COVID-19: Secondary | ICD-10-CM

## 2021-10-09 HISTORY — DX: COVID-19: U07.1

## 2021-10-20 ENCOUNTER — Other Ambulatory Visit: Payer: Self-pay

## 2021-10-20 ENCOUNTER — Encounter: Payer: Self-pay | Admitting: Surgery

## 2021-10-20 ENCOUNTER — Other Ambulatory Visit: Payer: Self-pay | Admitting: Surgery

## 2021-10-20 ENCOUNTER — Ambulatory Visit: Payer: Medicare PPO | Admitting: Surgery

## 2021-10-20 VITALS — BP 118/79 | HR 102 | Temp 98.3°F | Ht 61.0 in | Wt 148.8 lb

## 2021-10-20 DIAGNOSIS — Z170 Estrogen receptor positive status [ER+]: Secondary | ICD-10-CM | POA: Diagnosis not present

## 2021-10-20 DIAGNOSIS — C50412 Malignant neoplasm of upper-outer quadrant of left female breast: Secondary | ICD-10-CM

## 2021-10-20 DIAGNOSIS — C50912 Malignant neoplasm of unspecified site of left female breast: Secondary | ICD-10-CM

## 2021-10-20 DIAGNOSIS — R928 Other abnormal and inconclusive findings on diagnostic imaging of breast: Secondary | ICD-10-CM

## 2021-10-20 NOTE — Progress Notes (Unsigned)
Patient ID: Katie Dennis, female   DOB: April 04, 1955, 66 y.o.   MRN: [id number]  HPI Katie Dennis is a 66 y.o. female seen in consultation for newly diagnoses left breast Ca. It was found on routine mammo. I have pers. Reviewed images showing an asymmetry 1 30 o'clock left breast 8 cms form nipple.Negative ax lymph node. She has seen Dr.Kavita from med onc. Ontotype pending.  Hx of birth-control for 1 year.  Maternal aunt and maternal cousin positive for breast cancer.  Menses at age 32.  She is G2, P1 M1 a 0.  She did breast-feed.  She denies any breast lump no nipple discharge no skin changes.  Prior to this screening mammogram on July 2023 she did not have another one for 5 years. Targeted ultrasound was performed of the LEFT upper outer breast by radiologist. At 1:30 8 cm from nipple, there is an irregular hypoechoic mass with irregular margins. It measures 5 x 4 x 5 mm. FINAL PATH BREAST MASS, LEFT 1:30 8 CM FN; ULTRASOUND-GUIDED BIOPSY: - INVASIVE MAMMARY CARCINOMA, NO SPECIAL ER +, PR +, Her2 Neg. 7 mm in this sample. Grade 2. Ductal carcinoma in situ: Not identified. Lymphovascular invasion: Not identified. Biopsy of lymph node no evidence of CA     HPI  Past Medical History:  Diagnosis Date   Allergic rhinitis    Pelvis fracture (HCC) 1992   traction x 1 month, L.L. pneumothorax     Past Surgical History:  Procedure Laterality Date   VAGINAL DELIVERY     x 1    Family History  Problem Relation Age of Onset   Hypertension Mother    Dementia Mother    Frontotemporal dementia Mother    Meniere's disease Father    Breast cancer Maternal Aunt    Coronary artery disease Maternal Grandfather    Hypertension Maternal Grandfather    Diabetes Neg Hx     Social History Social History   Tobacco Use   Smoking status: Former    Years: 30.00    Types: Cigarettes    Quit date: 02/09/1994    Years since quitting: 27.7   Smokeless tobacco: Never  Substance Use Topics    Alcohol use: No    Alcohol/week: 0.0 standard drinks of alcohol   Drug use: No    No Known Allergies  Current Outpatient Medications  Medication Sig Dispense Refill   cyclobenzaprine (FLEXERIL) 10 MG tablet TAKE 1 TO 2 TABLETS BY MOUTH DAILY AT BEDTIME 60 tablet 1   DULoxetine (CYMBALTA) 60 MG capsule TAKE 1 CAPSULE BY MOUTH EVERY DAY 90 capsule 1   hydrochlorothiazide (HYDRODIURIL) 25 MG tablet TAKE 1 TABLET BY MOUTH EVERY DAY 90 tablet 1   losartan (COZAAR) 100 MG tablet TAKE 1 TABLET BY MOUTH EVERY DAY 90 tablet 3   senna (SENOKOT) 8.6 MG tablet Take 4 tablets by mouth daily.     traMADol (ULTRAM) 50 MG tablet TAKE 1 TABLET BY MOUTH EVERY MORNING AND 2 TABLETS BY MOUTH EVERY EVENING 90 tablet 0   No current facility-administered medications for this visit.     Review of Systems Full ROS  was asked and was negative except for the information on the HPI  Physical Exam Blood pressure 118/79, pulse (!) 102, temperature 98.3 F (36.8 C), temperature source Oral, height _0  (1.549 m), weight 148 lb 12.8 oz (67.5 kg), SpO2 96 %. CONSTITUTIONAL: NAD. EYES: Pupils are equal, round, Sclera are non-icteric. EARS, NOSE, MOUTH AND THROAT:  The oropharynx is clear. The oral mucosa is pink and moist. Hearing is intact to voice. LYMPH NODES:  Lymph nodes in the neck are normal. BREASTS: There is evidence of a small scar from recent biopsy located on the left breast upper outer quadrant.  There is no evidence of any palpable lesions in either breast.  There is no evidence of skin changes, there is no abnormal nipples.  There is no evidence of axillary lymphadenopathy RESPIRATORY:  Lungs are clear. There is normal respiratory effort, with equal breath sounds bilaterally, and without pathologic use of accessory muscles. CARDIOVASCULAR: Heart is regular without murmurs, gallops, or rubs. GI: The abdomen is  soft, nontender, and nondistended. There are no palpable masses. There is no  hepatosplenomegaly. There are normal bowel sounds in all quadrants. GU: Rectal deferred.   MUSCULOSKELETAL: Normal muscle strength and tone. No cyanosis or edema.   SKIN: Turgor is good and there are no pathologic skin lesions or ulcers. NEUROLOGIC: Motor and sensation is grossly normal. Cranial nerves are grossly intact. PSYCH:  Oriented to person, place and time. Affect is normal.  Data Reviewed  I have personally reviewed the patient's imaging, laboratory findings and medical records.    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  I had an extensive discussion with the patient and her husband about breast cancer and specifically about multimodal therapy.  We talked at length about different surgical options to include lumpectomy breast radiation therapy with axillary sampling versus mastectomy.  We talked about each of the pros and cons.  She is very interested in breast preservation surgery.  She understand that this carries a risk for positive margins and if that were the case she might require reexcision.  She wishes to move forward with planned surgical intervention as soon as possible.  We will tentatively plan to schedule her for left lumpectomy with tag guidance as well as a sentinel lymph node biopsy.  Procedure discussed with the patient and the family in detail.  Risks, benefits and possible complications including but not limited to: Bleeding, infection, lymphedema, margin positivity, need for reexcision, breast deformity.  She understands and wished to proceed.  Please note that I spent greater than 60 minutes in this encounter including personally reviewing imaging studies, counseling the patient, placing orders, reviewing records and performing appropriate documentation  Caroleen Hamman, MD FACS General Surgeon 10/20/2021, 2:19 PM

## 2021-10-20 NOTE — H&P (View-Only) (Signed)
Patient ID: Katie Dennis, female   DOB: April 04, 1955, 66 y.o.   MRN: [id number]  HPI Katie Dennis is a 66 y.o. female seen in consultation for newly diagnoses left breast Ca. It was found on routine mammo. I have pers. Reviewed images showing an asymmetry 1 30 o'clock left breast 8 cms form nipple.Negative ax lymph node. She has seen Dr.Kavita from med onc. Ontotype pending.  Hx of birth-control for 1 year.  Maternal aunt and maternal cousin positive for breast cancer.  Menses at age 32.  She is G2, P1 M1 a 0.  She did breast-feed.  She denies any breast lump no nipple discharge no skin changes.  Prior to this screening mammogram on July 2023 she did not have another one for 5 years. Targeted ultrasound was performed of the LEFT upper outer breast by radiologist. At 1:30 8 cm from nipple, there is an irregular hypoechoic mass with irregular margins. It measures 5 x 4 x 5 mm. FINAL PATH BREAST MASS, LEFT 1:30 8 CM FN; ULTRASOUND-GUIDED BIOPSY: - INVASIVE MAMMARY CARCINOMA, NO SPECIAL ER +, PR +, Her2 Neg. 7 mm in this sample. Grade 2. Ductal carcinoma in situ: Not identified. Lymphovascular invasion: Not identified. Biopsy of lymph node no evidence of CA     HPI  Past Medical History:  Diagnosis Date   Allergic rhinitis    Pelvis fracture (HCC) 1992   traction x 1 month, L.L. pneumothorax     Past Surgical History:  Procedure Laterality Date   VAGINAL DELIVERY     x 1    Family History  Problem Relation Age of Onset   Hypertension Mother    Dementia Mother    Frontotemporal dementia Mother    Meniere's disease Father    Breast cancer Maternal Aunt    Coronary artery disease Maternal Grandfather    Hypertension Maternal Grandfather    Diabetes Neg Hx     Social History Social History   Tobacco Use   Smoking status: Former    Years: 30.00    Types: Cigarettes    Quit date: 02/09/1994    Years since quitting: 27.7   Smokeless tobacco: Never  Substance Use Topics    Alcohol use: No    Alcohol/week: 0.0 standard drinks of alcohol   Drug use: No    No Known Allergies  Current Outpatient Medications  Medication Sig Dispense Refill   cyclobenzaprine (FLEXERIL) 10 MG tablet TAKE 1 TO 2 TABLETS BY MOUTH DAILY AT BEDTIME 60 tablet 1   DULoxetine (CYMBALTA) 60 MG capsule TAKE 1 CAPSULE BY MOUTH EVERY DAY 90 capsule 1   hydrochlorothiazide (HYDRODIURIL) 25 MG tablet TAKE 1 TABLET BY MOUTH EVERY DAY 90 tablet 1   losartan (COZAAR) 100 MG tablet TAKE 1 TABLET BY MOUTH EVERY DAY 90 tablet 3   senna (SENOKOT) 8.6 MG tablet Take 4 tablets by mouth daily.     traMADol (ULTRAM) 50 MG tablet TAKE 1 TABLET BY MOUTH EVERY MORNING AND 2 TABLETS BY MOUTH EVERY EVENING 90 tablet 0   No current facility-administered medications for this visit.     Review of Systems Full ROS  was asked and was negative except for the information on the HPI  Physical Exam Blood pressure 118/79, pulse (!) 102, temperature 98.3 F (36.8 C), temperature source Oral, height _0  (1.549 m), weight 148 lb 12.8 oz (67.5 kg), SpO2 96 %. CONSTITUTIONAL: NAD. EYES: Pupils are equal, round, Sclera are non-icteric. EARS, NOSE, MOUTH AND THROAT:  The oropharynx is clear. The oral mucosa is pink and moist. Hearing is intact to voice. LYMPH NODES:  Lymph nodes in the neck are normal. BREASTS: There is evidence of a small scar from recent biopsy located on the left breast upper outer quadrant.  There is no evidence of any palpable lesions in either breast.  There is no evidence of skin changes, there is no abnormal nipples.  There is no evidence of axillary lymphadenopathy RESPIRATORY:  Lungs are clear. There is normal respiratory effort, with equal breath sounds bilaterally, and without pathologic use of accessory muscles. CARDIOVASCULAR: Heart is regular without murmurs, gallops, or rubs. GI: The abdomen is  soft, nontender, and nondistended. There are no palpable masses. There is no  hepatosplenomegaly. There are normal bowel sounds in all quadrants. GU: Rectal deferred.   MUSCULOSKELETAL: Normal muscle strength and tone. No cyanosis or edema.   SKIN: Turgor is good and there are no pathologic skin lesions or ulcers. NEUROLOGIC: Motor and sensation is grossly normal. Cranial nerves are grossly intact. PSYCH:  Oriented to person, place and time. Affect is normal.  Data Reviewed  I have personally reviewed the patient's imaging, laboratory findings and medical records.    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  I had an extensive discussion with the patient and her husband about breast cancer and specifically about multimodal therapy.  We talked at length about different surgical options to include lumpectomy breast radiation therapy with axillary sampling versus mastectomy.  We talked about each of the pros and cons.  She is very interested in breast preservation surgery.  She understand that this carries a risk for positive margins and if that were the case she might require reexcision.  She wishes to move forward with planned surgical intervention as soon as possible.  We will tentatively plan to schedule her for left lumpectomy with tag guidance as well as a sentinel lymph node biopsy.  Procedure discussed with the patient and the family in detail.  Risks, benefits and possible complications including but not limited to: Bleeding, infection, lymphedema, margin positivity, need for reexcision, breast deformity.  She understands and wished to proceed.  Please note that I spent greater than 60 minutes in this encounter including personally reviewing imaging studies, counseling the patient, placing orders, reviewing records and performing appropriate documentation  Katie Hamman, MD FACS General Surgeon 10/20/2021, 2:19 PM

## 2021-10-20 NOTE — Patient Instructions (Signed)
Our surgery scheduler will call you within 24-48 hours to schedule your surgery. Please have the Blue surgery sheet available when speaking with her.  Lumpectomy  A lumpectomy, sometimes called a partial mastectomy, is surgery to remove a cancerous tumor or mass (the lump) from a breast. It is a form of breast-conserving or breast-preservation surgery. This means that the cancerous tissue is removed but the breast remains intact. During a lumpectomy, the portion of the breast that contains the tumor is removed. Lymph nodes under your arm may also be removed. Lymph nodes are part of the body's disease-fighting system (immune system) and are usually the first place where breast cancer spreads. Tell a health care provider about: Any allergies you have. All medicines you are taking, including vitamins, herbs, eye drops, creams, and over-the-counter medicines. Any problems you or family members have had with anesthetic medicines. Any bleeding problems you have. Any surgeries you have had. Any medical conditions you have. Whether you are pregnant or may be pregnant. What are the risks? Generally, this is a safe procedure. However, problems may occur, including: Bleeding. Infection. Allergic reaction to medicines. Pain, swelling, weakness, or numbness in the arm on the side of your surgery. Temporary swelling. Change in the shape of the breast, especially if a large portion is removed. Scar tissue that forms at the surgical site and feels hard to the touch. Blood clots. What happens before the procedure? When to stop eating and drinking Follow instructions from your health care provider about what you may eat and drink. These may include: 8 hours before your procedure Stop eating most foods. Do not eat meat, fried foods, or fatty foods. Eat only light foods, such as toast or crackers. All liquids are okay except energy drinks and alcohol. 6 hours before your procedure Stop eating. Drink  only clear liquids, such as water, clear fruit juice, black coffee, plain tea, and sports drinks. Do not drink energy drinks or alcohol. 2 hours before your procedure Stop drinking all liquids. You may be allowed to take medicines with small sips of water. If you do not follow your health care provider's instructions, your procedure may be delayed or canceled. Medicines Ask your health care provider about: Changing or stopping your regular medicines. These include any diabetes medicines or blood thinners you take. Taking medicines such as aspirin and ibuprofen. These medicines can thin your blood. Do not take them unless your health care provider tells you to. Taking over-the-counter medicines, vitamins, herbs, and supplements. Surgery safety Ask your health care provider how your surgery site will be marked. A procedure may be done to locate and mark the tumor area in the breast (localization). This will guide the surgeon to where the incision will be made. This may be done with: Imaging, such as a mammogram, ultrasound, or MRI. Insertion of a small wire, clip, or seed, or an implant that will reflect a radar signal. Also, ask what steps will be taken to help prevent infection. These may include: Washing skin with a germ-killing soap. Taking antibiotic medicine. General instructions You may have screening tests or exams to get normal measurements of your arm, also called baseline measurements. These can be compared to measurements done after surgery to monitor for swelling (lymphedema) that can develop after having lymph nodes removed. If you will be going home right after the procedure, plan to have a responsible adult: Take you home from the hospital or clinic. You will not be allowed to drive. Care for you for   the time you are told. What happens during the procedure?  An IV will be inserted into one of your veins. You may be given: A sedative. This helps you relax. Anesthesia. This  will: Numb certain areas of your body. Make you fall asleep for surgery. An electric scalpel will be used to reduce bleeding (electrocautery knife). A curved incision that follows the natural curve of your breast will be made. The tumor will be removed along with some of the tissue around it. This will be sent to the lab for testing. Your health care provider may also remove lymph nodes at this time if needed. If the tumor is close to the muscles over your chest, some muscle tissue may also be removed. A small drain tube may be inserted into your breast area or armpit to collect fluid that may build up after surgery. This tube will be connected to a suction bulb on the outside of your body to remove the fluid. The incision will be closed with stitches (sutures). A bandage (dressing) may be placed over the incision. The procedure may vary among health care providers and hospitals. What happens after the procedure? Your blood pressure, heart rate, breathing rate, and blood oxygen level will be monitored until you leave the hospital or clinic. You will be given medicine for pain as needed. You will be encouraged to get up and walk as soon as you can. This will improve blood flow and breathing. Ask for help if you feel weak or unsteady. You may have a drain tube in place for 2-3 days to prevent a collection of blood (hematoma) from developing in the breast. You may have a pressure bandage applied for 1-2 days to prevent bleeding or swelling. Ask your health care provider how to care for your bandage at home. You may be given a tight sleeve to wear over your arm on the side of your surgery. Wear the sleeve as told by your health care provider. Do not drive or operate machinery until your health care provider says that it is safe. Where to find more information American Cancer Society: cancer.org National Cancer Institute: cancer.gov Summary A lumpectomy, sometimes called a partial mastectomy, is  surgery to remove a cancerous tumor or mass (the lump) from a breast. During a lumpectomy, the portion of the breast that contains the tumor is removed. Plan to have someone take you home from the hospital or clinic. You may have a drain tube in place for 2-3 days to prevent a collection of blood (hematoma) from developing in the breast. This information is not intended to replace advice given to you by your health care provider. Make sure you discuss any questions you have with your health care provider. Document Revised: 04/21/2021 Document Reviewed: 04/06/2021 Elsevier Patient Education  2023 Elsevier Inc.  

## 2021-10-21 ENCOUNTER — Telehealth: Payer: Self-pay | Admitting: Surgery

## 2021-10-21 ENCOUNTER — Encounter: Payer: Self-pay | Admitting: Surgery

## 2021-10-21 NOTE — Telephone Encounter (Signed)
Patient has been advised of Pre-Admission date/time, and Surgery date at [facility].  Surgery Date: 10/28/21 patient to arrive at [facility] at 10:00 am as will be having SLN bx injection with Nuc Med done prior to surgery.   Preadmission Testing Date: 10/23/21 (phone 8a-1p)  Patient also reminded of her breast tag placement at [facility] on 10/23/21 @ 1:00.

## 2021-10-23 ENCOUNTER — Ambulatory Visit
Admission: RE | Admit: 2021-10-23 | Discharge: 2021-10-23 | Disposition: A | Discharge status: Home / Self Care | Payer: Medicare PPO | Source: Ambulatory Visit | Attending: Surgery | Admitting: Surgery

## 2021-10-23 ENCOUNTER — Encounter
Admission: RE | Admit: 2021-10-23 | Discharge: 2021-10-23 | Disposition: A | Discharge status: Home / Self Care | Payer: Medicare PPO | Source: Ambulatory Visit | Attending: Surgery | Admitting: Surgery

## 2021-10-23 DIAGNOSIS — R928 Other abnormal and inconclusive findings on diagnostic imaging of breast: Secondary | ICD-10-CM | POA: Diagnosis not present

## 2021-10-23 DIAGNOSIS — C50912 Malignant neoplasm of unspecified site of left female breast: Secondary | ICD-10-CM | POA: Diagnosis not present

## 2021-10-23 HISTORY — DX: Anemia, unspecified: D64.9

## 2021-10-23 HISTORY — DX: Fibromyalgia: M79.7

## 2021-10-23 HISTORY — DX: Essential (primary) hypertension: I10

## 2021-10-23 HISTORY — DX: Nonrheumatic mitral (valve) prolapse: I34.1

## 2021-10-23 NOTE — Patient Instructions (Signed)
Your procedure is scheduled on: 10/28/21 Report to  [facility] and check in at 10:00 as previously instructed [facility] [employer] [facility] is available free of charge at the medical mall entrance..  Remember: Instructions that are not followed completely may result in serious medical risk, up to and including death, or upon the discretion of your surgeon and anesthesiologist your surgery may need to be rescheduled.     _X__ 1. Do not eat food or drink any liquids after midnight the night before your procedure.                 No gum chewing or hard candies.   __X__2.  On the morning of surgery brush your teeth with toothpaste and water, you                 may rinse your mouth with mouthwash if you wish.  Do not swallow any              toothpaste of mouthwash.     _X__ 3.  No Alcohol for 24 hours before or after surgery.   _X__ 4.  Do Not Smoke or use e-cigarettes For 24 Hours Prior to Your Surgery.                 Do not use any chewable tobacco products for at least 6 hours prior to                 surgery.  ____  5.  Bring all medications with you on the day of surgery if instructed.   __X__  6.  Notify your doctor if there is any change in your medical condition      (cold, fever, infections).     Do not wear jewelry, make-up, hairpins, clips or nail polish. Do not wear lotions, powders, or perfumes.  Do not shave body hair 48 hours prior to surgery. Men may shave face and neck. Do not bring valuables to the hospital.    [facility] is not responsible for any belongings or valuables.  Contacts, dentures/partials or body piercings may not be worn into surgery. Bring a case for your contacts, glasses or hearing aids, a denture cup will be supplied. Leave your suitcase in the car. After surgery it may be brought to your room. For patients admitted to the hospital, discharge time is determined by your treatment team.   Patients discharged the day of surgery will not be allowed to drive  home.    __X__ Take these medicines the morning of surgery with A SIP OF WATER:    1. none  2.   3.   4.  5.  6.  ____ Fleet Enema (as directed)   __X__ Use CHG Soap/SAGE wipes as directed  You may pick this soap up at our office in the Medical arts building [address] or get a bar of "gold" Dial soap. Shower daily including the day of surgery with Dial.   ____ Use inhalers on the day of surgery  ____ Stop metformin/Janumet/Farxiga 2 days prior to surgery    ____ Take 1/2 of usual insulin dose the night before surgery. No insulin the morning          of surgery.   ____ Stop Blood Thinners Coumadin/Plavix/Xarelto/Pleta/Pradaxa/Eliquis/Effient/Aspirin  on   Or contact your Surgeon, Cardiologist or Medical Doctor regarding  ability to stop your blood thinners  __X__ Stop Anti-inflammatories 7 days before surgery such as Advil, Ibuprofen, Motrin,  BC or  Goodies Powder, Naprosyn, Naproxen, Aleve, Aspirin   Tylenol may be used instead if needed  __X__ Stop all herbals and supplements, fish oil or vitamins  until after surgery.    ____ Bring C-Pap to the hospital.

## 2021-10-28 ENCOUNTER — Ambulatory Visit
Admission: RE | Admit: 2021-10-28 | Discharge: 2021-10-28 | Disposition: A | Discharge status: Home / Self Care | Payer: Medicare PPO | Source: Ambulatory Visit | Attending: Surgery | Admitting: Surgery

## 2021-10-28 ENCOUNTER — Ambulatory Visit
Admission: RE | Admit: 2021-10-28 | Discharge: 2021-10-28 | Disposition: A | Discharge status: Home / Self Care | Payer: Medicare PPO | Attending: Surgery | Admitting: Surgery

## 2021-10-28 ENCOUNTER — Other Ambulatory Visit: Payer: Self-pay

## 2021-10-28 ENCOUNTER — Ambulatory Visit: Payer: Medicare PPO | Admitting: Anesthesiology

## 2021-10-28 ENCOUNTER — Encounter: Payer: Self-pay | Admitting: Surgery

## 2021-10-28 ENCOUNTER — Encounter
Admission: RE | Disposition: A | Discharge status: Home / Self Care | Payer: Self-pay | Source: Home / Self Care | Attending: Surgery

## 2021-10-28 DIAGNOSIS — M797 Fibromyalgia: Secondary | ICD-10-CM | POA: Insufficient documentation

## 2021-10-28 DIAGNOSIS — I10 Essential (primary) hypertension: Secondary | ICD-10-CM | POA: Diagnosis not present

## 2021-10-28 DIAGNOSIS — C50412 Malignant neoplasm of upper-outer quadrant of left female breast: Secondary | ICD-10-CM | POA: Insufficient documentation

## 2021-10-28 DIAGNOSIS — Z8616 Personal history of COVID-19: Secondary | ICD-10-CM | POA: Insufficient documentation

## 2021-10-28 DIAGNOSIS — Z803 Family history of malignant neoplasm of breast: Secondary | ICD-10-CM | POA: Insufficient documentation

## 2021-10-28 DIAGNOSIS — Z170 Estrogen receptor positive status [ER+]: Secondary | ICD-10-CM | POA: Insufficient documentation

## 2021-10-28 DIAGNOSIS — C50912 Malignant neoplasm of unspecified site of left female breast: Secondary | ICD-10-CM | POA: Diagnosis not present

## 2021-10-28 DIAGNOSIS — Z87891 Personal history of nicotine dependence: Secondary | ICD-10-CM | POA: Insufficient documentation

## 2021-10-28 DIAGNOSIS — R928 Other abnormal and inconclusive findings on diagnostic imaging of breast: Secondary | ICD-10-CM

## 2021-10-28 HISTORY — PX: BREAST LUMPECTOMY,RADIO FREQ LOCALIZER,AXILLARY SENTINEL LYMPH NODE BIOPSY: SHX6900

## 2021-10-28 SURGERY — BREAST LUMPECTOMY,RADIO FREQ LOCALIZER,AXILLARY SENTINEL LYMPH NODE BIOPSY
Anesthesia: General | Laterality: Left

## 2021-10-28 MED ORDER — BUPIVACAINE-EPINEPHRINE (PF) 0.25% -1:200000 IJ SOLN
INTRAMUSCULAR | Status: AC
  Filled 2021-10-28: qty 30

## 2021-10-28 MED ORDER — PROPOFOL 10 MG/ML IV BOLUS
INTRAVENOUS | Status: DC | PRN
  Administered 2021-10-28: 40 mg via INTRAVENOUS
  Administered 2021-10-28: 130 mg via INTRAVENOUS

## 2021-10-28 MED ORDER — DEXAMETHASONE SODIUM PHOSPHATE 10 MG/ML IJ SOLN
INTRAMUSCULAR | Status: AC
  Filled 2021-10-28: qty 1

## 2021-10-28 MED ORDER — PHENYLEPHRINE 80 MCG/ML (10ML) SYRINGE FOR IV PUSH (FOR BLOOD PRESSURE SUPPORT)
PREFILLED_SYRINGE | INTRAVENOUS | Status: AC
  Filled 2021-10-28: qty 10

## 2021-10-28 MED ORDER — CHLORHEXIDINE GLUCONATE CLOTH 2 % EX PADS
6.0000 | MEDICATED_PAD | Freq: Once | CUTANEOUS | Status: AC
  Administered 2021-10-28: 6 via TOPICAL

## 2021-10-28 MED ORDER — LACTATED RINGERS IV SOLN: INTRAVENOUS | Status: DC

## 2021-10-28 MED ORDER — FENTANYL CITRATE (PF) 100 MCG/2ML IJ SOLN: 25.0000 ug | INTRAMUSCULAR | Status: DC | PRN

## 2021-10-28 MED ORDER — ONDANSETRON HCL 4 MG/2ML IJ SOLN: 4.0000 mg | Freq: Once | INTRAMUSCULAR | Status: DC | PRN

## 2021-10-28 MED ORDER — DEXAMETHASONE SODIUM PHOSPHATE 10 MG/ML IJ SOLN
INTRAMUSCULAR | Status: DC | PRN
  Administered 2021-10-28: 10 mg via INTRAVENOUS

## 2021-10-28 MED ORDER — HYDROCODONE-ACETAMINOPHEN 5-325 MG PO TABS: 1.0000 | ORAL_TABLET | ORAL | 0 refills | Status: DC | PRN

## 2021-10-28 MED ORDER — LIDOCAINE HCL (PF) 2 % IJ SOLN
INTRAMUSCULAR | Status: AC
  Filled 2021-10-28: qty 5

## 2021-10-28 MED ORDER — MIDAZOLAM HCL 2 MG/2ML IJ SOLN
INTRAMUSCULAR | Status: AC
  Filled 2021-10-28: qty 2

## 2021-10-28 MED ORDER — ONDANSETRON HCL 4 MG/2ML IJ SOLN
INTRAMUSCULAR | Status: AC
  Filled 2021-10-28: qty 2

## 2021-10-28 MED ORDER — GLYCOPYRROLATE 0.2 MG/ML IJ SOLN
INTRAMUSCULAR | Status: AC
  Filled 2021-10-28: qty 1

## 2021-10-28 MED ORDER — ISOSULFAN BLUE 1 % ~~LOC~~ SOLN
SUBCUTANEOUS | Status: AC
  Filled 2021-10-28: qty 5

## 2021-10-28 MED ORDER — OXYCODONE HCL 5 MG PO TABS
5.0000 mg | ORAL_TABLET | Freq: Once | ORAL | Status: AC | PRN
  Administered 2021-10-28: 5 mg via ORAL

## 2021-10-28 MED ORDER — ORAL CARE MOUTH RINSE: 15.0000 mL | Freq: Once | OROMUCOSAL | Status: AC

## 2021-10-28 MED ORDER — PROPOFOL 10 MG/ML IV BOLUS
INTRAVENOUS | Status: AC
  Filled 2021-10-28: qty 20

## 2021-10-28 MED ORDER — FAMOTIDINE 20 MG PO TABS: 20.0000 mg | ORAL_TABLET | Freq: Once | ORAL | Status: AC

## 2021-10-28 MED ORDER — CHLORHEXIDINE GLUCONATE 0.12 % MT SOLN
OROMUCOSAL | Status: AC
  Administered 2021-10-28: 15 mL via OROMUCOSAL
  Filled 2021-10-28: qty 15

## 2021-10-28 MED ORDER — CELECOXIB 200 MG PO CAPS
ORAL_CAPSULE | ORAL | Status: AC
  Administered 2021-10-28: 200 mg via ORAL
  Filled 2021-10-28: qty 1

## 2021-10-28 MED ORDER — CEFAZOLIN SODIUM-DEXTROSE 2-4 GM/100ML-% IV SOLN
INTRAVENOUS | Status: AC
  Filled 2021-10-28: qty 100

## 2021-10-28 MED ORDER — BUPIVACAINE LIPOSOME 1.3 % IJ SUSP
INTRAMUSCULAR | Status: AC
  Filled 2021-10-28: qty 20

## 2021-10-28 MED ORDER — CHLORHEXIDINE GLUCONATE 0.12 % MT SOLN: 15.0000 mL | Freq: Once | OROMUCOSAL | Status: AC

## 2021-10-28 MED ORDER — ACETAMINOPHEN 500 MG PO TABS
ORAL_TABLET | ORAL | Status: AC
  Administered 2021-10-28: 1000 mg via ORAL
  Filled 2021-10-28: qty 2

## 2021-10-28 MED ORDER — CEFAZOLIN SODIUM-DEXTROSE 2-4 GM/100ML-% IV SOLN
2.0000 g | INTRAVENOUS | Status: AC
  Administered 2021-10-28: 2 g via INTRAVENOUS

## 2021-10-28 MED ORDER — LIDOCAINE HCL (CARDIAC) PF 100 MG/5ML IV SOSY
PREFILLED_SYRINGE | INTRAVENOUS | Status: DC | PRN
  Administered 2021-10-28: 100 mg via INTRAVENOUS

## 2021-10-28 MED ORDER — GLYCOPYRROLATE 0.2 MG/ML IJ SOLN
INTRAMUSCULAR | Status: DC | PRN
  Administered 2021-10-28: .2 mg via INTRAVENOUS

## 2021-10-28 MED ORDER — FENTANYL CITRATE (PF) 100 MCG/2ML IJ SOLN
INTRAMUSCULAR | Status: DC | PRN
  Administered 2021-10-28 (×3): 50 ug via INTRAVENOUS

## 2021-10-28 MED ORDER — ACETAMINOPHEN 500 MG PO TABS: 1000.0000 mg | ORAL_TABLET | ORAL | Status: AC

## 2021-10-28 MED ORDER — ACETAMINOPHEN 10 MG/ML IV SOLN: 1000.0000 mg | Freq: Once | INTRAVENOUS | Status: DC | PRN

## 2021-10-28 MED ORDER — PHENYLEPHRINE HCL (PRESSORS) 10 MG/ML IV SOLN
INTRAVENOUS | Status: DC | PRN
  Administered 2021-10-28 (×4): 80 ug via INTRAVENOUS

## 2021-10-28 MED ORDER — BUPIVACAINE-EPINEPHRINE (PF) 0.25% -1:200000 IJ SOLN
INTRAMUSCULAR | Status: DC | PRN
  Administered 2021-10-28: 50 mL via INTRAMUSCULAR

## 2021-10-28 MED ORDER — GABAPENTIN 300 MG PO CAPS: 300.0000 mg | ORAL_CAPSULE | ORAL | Status: AC

## 2021-10-28 MED ORDER — FENTANYL CITRATE (PF) 100 MCG/2ML IJ SOLN
INTRAMUSCULAR | Status: AC
  Filled 2021-10-28: qty 2

## 2021-10-28 MED ORDER — DEXMEDETOMIDINE HCL IN NACL 200 MCG/50ML IV SOLN
INTRAVENOUS | Status: DC | PRN
  Administered 2021-10-28 (×2): 8 ug via INTRAVENOUS

## 2021-10-28 MED ORDER — EPHEDRINE SULFATE (PRESSORS) 50 MG/ML IJ SOLN
INTRAMUSCULAR | Status: DC | PRN
  Administered 2021-10-28: 10 mg via INTRAVENOUS

## 2021-10-28 MED ORDER — OXYCODONE HCL 5 MG PO TABS
ORAL_TABLET | ORAL | Status: AC
  Filled 2021-10-28: qty 1

## 2021-10-28 MED ORDER — TECHNETIUM TC 99M TILMANOCEPT KIT
1.1000 | PACK | Freq: Once | INTRAVENOUS | Status: AC | PRN
  Administered 2021-10-28: 1.1 via INTRADERMAL

## 2021-10-28 MED ORDER — ISOSULFAN BLUE 1 % ~~LOC~~ SOLN
SUBCUTANEOUS | Status: DC | PRN
  Administered 2021-10-28: 5 mL via SUBCUTANEOUS

## 2021-10-28 MED ORDER — FAMOTIDINE 20 MG PO TABS
ORAL_TABLET | ORAL | Status: AC
  Administered 2021-10-28: 20 mg via ORAL
  Filled 2021-10-28: qty 1

## 2021-10-28 MED ORDER — GABAPENTIN 300 MG PO CAPS
ORAL_CAPSULE | ORAL | Status: AC
  Administered 2021-10-28: 300 mg via ORAL
  Filled 2021-10-28: qty 1

## 2021-10-28 MED ORDER — CHLORHEXIDINE GLUCONATE CLOTH 2 % EX PADS: 6.0000 | MEDICATED_PAD | Freq: Once | CUTANEOUS | Status: DC

## 2021-10-28 MED ORDER — CELECOXIB 200 MG PO CAPS: 200.0000 mg | ORAL_CAPSULE | ORAL | Status: AC

## 2021-10-28 MED ORDER — STERILE WATER FOR IRRIGATION IR SOLN
Status: DC | PRN
  Administered 2021-10-28: 500 mL

## 2021-10-28 MED ORDER — MIDAZOLAM HCL 2 MG/2ML IJ SOLN
INTRAMUSCULAR | Status: DC | PRN
  Administered 2021-10-28: 2 mg via INTRAVENOUS

## 2021-10-28 MED ORDER — ONDANSETRON HCL 4 MG/2ML IJ SOLN
INTRAMUSCULAR | Status: DC | PRN
  Administered 2021-10-28: 4 mg via INTRAVENOUS

## 2021-10-28 MED ORDER — OXYCODONE HCL 5 MG/5ML PO SOLN: 5.0000 mg | Freq: Once | ORAL | Status: AC | PRN

## 2021-10-28 SURGICAL SUPPLY — 53 items
ADH SKN CLS APL DERMABOND .7 (GAUZE/BANDAGES/DRESSINGS) ×2
APL PRP FLT STRL LF DISP 70% (MISCELLANEOUS) ×1
APPLICATOR CHLORAPREP 10 TEAL (MISCELLANEOUS) ×1 IMPLANT
APPLIER CLIP 9.375 SM OPEN (CLIP) ×2
APR CLP SM 9.3 20 MLT OPN (CLIP) ×2
BLADE SURG 15 STRL LF DISP TIS (BLADE) ×1 IMPLANT
BLADE SURG 15 STRL SS (BLADE) ×1
CLIP APPLIE 9.375 SM OPEN (CLIP) IMPLANT
CNTNR SPEC 2.5X3XGRAD LEK (MISCELLANEOUS)
CONT SPEC 4OZ STER OR WHT (MISCELLANEOUS)
CONT SPEC 4OZ STRL OR WHT (MISCELLANEOUS)
CONTAINER SPEC 2.5X3XGRAD LEK (MISCELLANEOUS) ×2 IMPLANT
COVER PROBE GAMMA FINDER SLV (MISCELLANEOUS) ×1 IMPLANT
DERMABOND ADVANCED .7 DNX12 (GAUZE/BANDAGES/DRESSINGS) ×1 IMPLANT
DEVICE DUBIN SPECIMEN MAMMOGRA (MISCELLANEOUS) ×1 IMPLANT
DRAPE INCISE IOBAN 66X60 STRL (DRAPES) ×1 IMPLANT
DRAPE LAPAROTOMY TRNSV 106X77 (MISCELLANEOUS) ×1 IMPLANT
ELECT CAUTERY BLADE 6.4 (BLADE) ×1 IMPLANT
ELECT REM PT RETURN 9FT ADLT (ELECTROSURGICAL) ×1
ELECTRODE REM PT RTRN 9FT ADLT (ELECTROSURGICAL) ×1 IMPLANT
GAUZE 4X4 16PLY ~~LOC~~+RFID DBL (SPONGE) ×1 IMPLANT
GLOVE BIO SURGEON STRL SZ7 (GLOVE) ×1 IMPLANT
GOWN STRL REUS W/ TWL LRG LVL3 (GOWN DISPOSABLE) ×2 IMPLANT
GOWN STRL REUS W/TWL LRG LVL3 (GOWN DISPOSABLE) ×2
KIT MARKER MARGIN INK (KITS) IMPLANT
KIT TURNOVER KIT A (KITS) ×1 IMPLANT
LABEL OR SOLS (LABEL) ×1 IMPLANT
MANIFOLD NEPTUNE II (INSTRUMENTS) ×1 IMPLANT
MARGIN MAP 10MM (MISCELLANEOUS) IMPLANT
MARKER MARGIN CORRECT CLIP (MARKER) IMPLANT
NDL FILTER BLUNT 18X1 1/2 (NEEDLE) ×1 IMPLANT
NEEDLE FILTER BLUNT 18X1 1/2 (NEEDLE) ×1 IMPLANT
NEEDLE HYPO 22GX1.5 SAFETY (NEEDLE) ×1 IMPLANT
PACK BASIN MINOR ARMC (MISCELLANEOUS) ×1 IMPLANT
PENCIL SMOKE EVACUATOR (MISCELLANEOUS) IMPLANT
SET LOCALIZER 20 PROBE US (MISCELLANEOUS) IMPLANT
SPONGE KITTNER 5P (MISCELLANEOUS) IMPLANT
SPONGE T-LAP 18X18 ~~LOC~~+RFID (SPONGE) ×1 IMPLANT
SUT MNCRL 4-0 (SUTURE) ×2
SUT MNCRL 4-0 27XMFL (SUTURE) ×2
SUT SILK 2 0 SH (SUTURE) IMPLANT
SUT VIC AB 0 SH 27 (SUTURE) IMPLANT
SUT VIC AB 2-0 SH 27 (SUTURE) ×3
SUT VIC AB 2-0 SH 27XBRD (SUTURE) ×1 IMPLANT
SUT VIC AB 3-0 SH 27 (SUTURE) ×2
SUT VIC AB 3-0 SH 27X BRD (SUTURE) ×2 IMPLANT
SUTURE MNCRL 4-0 27XMF (SUTURE) ×2 IMPLANT
SYR 10ML LL (SYRINGE) ×1 IMPLANT
SYR 20ML LL LF (SYRINGE) ×1 IMPLANT
TRAP FLUID SMOKE EVACUATOR (MISCELLANEOUS) ×1 IMPLANT
TRAP NEPTUNE SPECIMEN COLLECT (MISCELLANEOUS) ×1 IMPLANT
WATER STERILE IRR 1000ML POUR (IV SOLUTION) ×1 IMPLANT
WATER STERILE IRR 500ML POUR (IV SOLUTION) ×1 IMPLANT

## 2021-10-28 NOTE — Discharge Instructions (Addendum)
Lumpectomy, Care After The following information offers guidance on how to care for yourself after your procedure. Your health care provider may also give you more specific instructions. If you have problems or questions, contact your health care provider. What can I expect after the procedure? After the procedure, it is common to have: Some pain or redness at the incision site. Breast swelling. Breast tenderness. Stiffness in your arm or shoulder. A change in the shape and feel of your breast. Scar tissue that feels hard to the touch in the area where the lump was removed. Follow these instructions at home: Medicines Take over-the-counter and prescription medicines only as told by your health care provider. If you were prescribed an antibiotic, take it as told by your health care provider. Do not stop taking the antibiotic even if you start to feel better. Ask your health care provider if the medicine prescribed to you: Requires you to avoid driving or using machinery. Can cause constipation. You may need to take these actions to prevent or treat constipation: Drink enough fluid to keep your urine pale yellow. Take over-the-counter or prescription medicines. Eat foods that are high in fiber, such as beans, whole grains, and fresh fruits and vegetables. Limit foods that are high in fat and processed sugars, such as fried or sweet foods. Incision care A person washing hands with soap and water.      Two stitched incisions: One is normal and the other is red with pus.    Follow instructions from your health care provider about how to take care of your incision. Make sure you: Wash your hands with soap and water for at least 20 seconds before and after you change your bandage (dressing). If soap and water are not available, use hand sanitizer. Change your dressing as told by your health care provider. Leave stitches (sutures), skin glue, or adhesive strips in place. These skin closures  may need to stay in place for 2 weeks or longer. If adhesive strip edges start to loosen and curl up, you may trim the loose edges. Do not remove adhesive strips completely unless your health care provider tells you to do that. Check your incision area every day for signs of infection. Check for: More redness, swelling, or pain. Fluid or blood. Warmth. Pus or a bad smell. Keep your dressing clean and dry. If you were sent home with a surgical drain in place, follow instructions from your health care provider about emptying it. Bathing Do not take baths, swim, or use a hot tub until your health care provider approves. Ask your health care provider if you may take showers. You may only be allowed to take sponge baths. Activity Rest as told by your health care provider. Do not sit for a long time without moving. Get up to take short walks every 1-2 hours. This will improve blood flow and breathing. Ask for help if you feel weak or unsteady. Be careful to avoid any activities that could cause an injury to your arm on the side of your surgery. Do not lift anything that is heavier than 10 lb (4.5 kg), or the limit that you are told, until your health care provider says that it is safe. Avoid lifting with the arm that is on the side of your surgery. Do not carry heavy objects on your shoulder on the side of your surgery. Do exercises to keep your shoulder and arm from getting stiff and swollen. Talk with your health care provider about  which exercises are safe for you. Return to your normal activities as told by your health care provider. Ask your health care provider what activities are safe for you. General instructions Wear a supportive bra as told by your health care provider. Raise (elevate) your arm above the level of your heart while you are sitting or lying down. Do not wear tight jewelry on your arm, wrist, or fingers on the side of your surgery. Wear compression stockings as told by your  health care provider. These stockings help to prevent blood clots and reduce swelling in your legs. If you had any lymph nodes removed during your procedure, be sure to tell all of your health care providers. It is important to share this information before you have certain procedures, such as blood tests or blood pressure measurements. Keep all follow-up visits. You may need to be screened for extra fluid around the lymph nodes and swelling in the breast and arm (lymphedema). Contact a health care provider if: You develop a rash. You have a fever. Your pain worsens or pain medicine is not working. You have swelling, weakness, or numbness in your arm that does not improve after a few weeks. You have new swelling in your breast. You have any of these signs of infection: More redness, swelling, or pain in your incision area. Fluid or blood coming from your incision. Warmth coming from the incision area. Pus or a bad smell coming from your incision. Get help right away if: You have very bad pain in your breast or arm. You have swelling in your legs or arms. You have redness, warmth, or pain in your leg or arm. You have chest pain. You have difficulty breathing. These symptoms may be an emergency. Get help right away. Call 911. Do not wait to see if the symptoms will go away. Do not drive yourself to the hospital. Summary After the procedure, it is common to have breast tenderness, swelling in your breast, and stiffness in your arm and shoulder. Follow instructions from your health care provider about how to take care of your incision. Do not lift anything that is heavier than 10 lb (4.5 kg), or the limit that you are told, until your health care provider says that it is safe. Avoid lifting with the arm that is on the side of your surgery. If you had any lymph nodes removed during your procedure, be sure to tell all of your health care providers. This information is not intended to replace  advice given to you by your health care provider. Make sure you discuss any questions you have with your health care provider. Document Revised: 04/06/2021 Document Reviewed: 04/06/2021 Elsevier Patient Education  [place]   The drugs that you were given will stay in your system until tomorrow so for the next 24 hours you should not:  Drive an automobile Make any legal decisions Drink any alcoholic beverage   You may resume regular meals tomorrow.  Today it is better to start with liquids and gradually work up to solid foods.  You may eat anything you prefer, but it is better to start with liquids, then soup and crackers, and gradually work up to solid foods.   Please notify your doctor immediately if you have any unusual bleeding, trouble breathing, redness and pain at the surgery site, drainage, fever, or pain not relieved by medication.    Additional Instructions:   Please contact your physician with  any problems or Same Day Surgery at [phone], Monday through Friday 6 am to 4 pm, or [place] at [facility] number at [phone].

## 2021-10-28 NOTE — Transfer of Care (Signed)
Immediate Anesthesia Transfer of Care Note  Patient: Katie Dennis  Procedure(s) Performed: BREAST LUMPECTOMY,RADIO FREQ LOCALIZER,AXILLARY SENTINEL LYMPH NODE BIOPSY (Left)  Patient Location: PACU  Anesthesia Type:General  Level of Consciousness: drowsy and patient cooperative  Airway & Oxygen Therapy: Patient Spontanous Breathing and Patient connected to face mask oxygen  Post-op Assessment: Report given to RN and Post -op Vital signs reviewed and stable  Post vital signs: Reviewed and stable  Last Vitals:  Vitals Value Taken Time  BP 112/68 10/28/21 1353  Temp    Pulse 89 10/28/21 1353  Resp 14 10/28/21 1353  SpO2 100 % 10/28/21 1353  Vitals shown include unvalidated device data.  Last Pain:  Vitals:   10/28/21 1047  TempSrc: Temporal  PainSc: 0-No pain         Complications: No notable events documented.

## 2021-10-28 NOTE — Anesthesia Procedure Notes (Signed)
Procedure Name: LMA Insertion Date/Time: 10/28/2021 11:43 AM  Performed by: Jonna Clark, CRNAPre-anesthesia Checklist: Patient identified, Patient being monitored, Timeout performed, Emergency Drugs available and Suction available Patient Re-evaluated:Patient Re-evaluated prior to induction Oxygen Delivery Method: Circle system utilized Preoxygenation: Pre-oxygenation with 100% oxygen Induction Type: IV induction Ventilation: Mask ventilation without difficulty LMA: LMA inserted LMA Size: 4.0 Tube type: Oral Number of attempts: 1 Placement Confirmation: positive ETCO2 and breath sounds checked- equal and bilateral Tube secured with: Tape Dental Injury: Teeth and Oropharynx as per pre-operative assessment

## 2021-10-28 NOTE — Anesthesia Preprocedure Evaluation (Addendum)
Anesthesia Evaluation  Patient identified by MRN, date of birth, ID band Patient awake    Reviewed: Allergy & Precautions, NPO status , Patient's Chart, lab work & pertinent test results  History of Anesthesia Complications Negative for: history of anesthetic complications  Airway Mallampati: IV   Neck ROM: Full    Dental no notable dental hx.    Pulmonary former smoker (quit 1996),  COVID 10/09/21   Pulmonary exam normal breath sounds clear to auscultation       Cardiovascular hypertension, Normal cardiovascular exam+ Valvular Problems/Murmurs MVP  Rhythm:Regular Rate:Normal  ECG 07/17/21: normal   Neuro/Psych negative neurological ROS     GI/Hepatic negative GI ROS,   Endo/Other  Prediabetes   Renal/GU negative Renal ROS     Musculoskeletal  (+) Arthritis , Fibromyalgia -  Abdominal   Peds  Hematology  (+) Blood dyscrasia, anemia ,   Anesthesia Other Findings   Reproductive/Obstetrics                            Anesthesia Physical Anesthesia Plan  ASA: 2  Anesthesia Plan: General   Post-op Pain Management:    Induction: Intravenous  PONV Risk Score and Plan: 3 and Ondansetron, Dexamethasone and Treatment may vary due to age or medical condition  Airway Management Planned: LMA  Additional Equipment:   Intra-op Plan:   Post-operative Plan: Extubation in OR  Informed Consent: I have reviewed the patients History and Physical, chart, labs and discussed the procedure including the risks, benefits and alternatives for the proposed anesthesia with the patient or authorized representative who has indicated his/her understanding and acceptance.     Dental advisory given  Plan Discussed with: CRNA  Anesthesia Plan Comments: (Patient consented for risks of anesthesia including but not limited to:  - adverse reactions to medications - damage to eyes, teeth, lips or other oral  mucosa - nerve damage due to positioning  - sore throat or hoarseness - damage to heart, brain, nerves, lungs, other parts of body or loss of life  Informed patient about role of CRNA in peri- and intra-operative care.  Patient voiced understanding.)        Anesthesia Quick Evaluation

## 2021-10-28 NOTE — Interval H&P Note (Signed)
History and Physical Interval Note:  10/28/2021 11:43 AM  Katie Dennis  has presented today for surgery, with the diagnosis of left breast cancer.  The various methods of treatment have been discussed with the patient and family. After consideration of risks, benefits and other options for treatment, the patient has consented to  Procedure(s): [place] (Left) as a surgical intervention.  The patient's history has been reviewed, patient examined, no change in status, stable for surgery.  I have reviewed the patient's chart and labs.  Questions were answered to the patient's satisfaction.     [place]

## 2021-10-28 NOTE — Op Note (Signed)
Pre-operative Diagnosis: Left Breast Cancer Upper outer quadrant    Post-operative Diagnosis: Same  Surgeon: Caroleen Hamman,  MD FACS  Anesthesia: GETA  Procedure: Left Partial mastectomy radar guided Using [facility] scout technology Sentinel node biopsy Complex closure with tissue rearrangements measuring 64cm2  Findings: Clip and Savi Scout device within breast specimen  Estimated Blood Loss: Minimal         Drains: None         Specimens: partial mastectomy with labels, Sentinel nodes        Complications: none                Condition: Stable   Procedure Details  The patient was seen again in the Holding Room. The benefits, complications, treatment options, and expected outcomes were discussed with the patient. The risks of bleeding, infection, recurrence of symptoms, failure to resolve symptoms, hematoma, seroma, open wound, cosmetic deformity, and the need for further surgery were discussed.  The patient was taken to Operating Room, identified  and the procedure verified.  A Time Out was held and the above information confirmed.  Prior to the induction of general anesthesia, antibiotic prophylaxis was administered. VTE prophylaxis was in place. Appropriate anesthesia was then administered and tolerated well. The chest was prepped with Chloraprep and draped in the sterile fashion. The patient was positioned in the supine position.  Isosulfan blue dye was injected periareolar early under aseptic conditions.  Using the hand-held probe an area of high counts was identified in the axilla, an incision was made and direction by the probe aided in dissection of a lymph node . I clipped the small lymphatic channels in the standard fashion. A total of 4 nodes that were both hot and blue were excised. The incision was closed in a two layer fashion using 2-0 vicryl and 4-0 monocryl for the skin.  Attention was turned to the left breast where I used the savi Scout probe to localize the lesion. An  incision was made encompassing the target in a crescent moon shaped fashion. Circumferential flaps were done with cautery. A 0 vicryl was used to elevated the breast parenchyma and I dissected the specimen in a circumferential fashion using cautery in order to perform a partial mastectomy with adequate margins . Hemostasis was with electrocautery. Specimen was painted for margin evaluation.  Tissue advancement flaps were performed to decrease the volume deficit in the area of the resection.  Please note that the total void area was 8 x 8 cm equals 64 cm.  The chest wall flaps were created by incising the breast parenchyma from the pectoralis fascia in a circumferential method.  The breast parenchyma was then reapproximated in a deep to superficial fashion using interrupted 2-0 Vicryl sutures.  Please note that I placed 2 deep layers of 2-0 Vicryl's.  Once assuring that hemostasis was adequate and checked multiple times the wound was closed with 4-0 subcuticular Monocryl sutures. Dermabond was placed  Patient was taken to the recovery room in stable condition.   Caroleen Hamman , MD, FACS

## 2021-10-29 ENCOUNTER — Encounter: Payer: Self-pay | Admitting: Surgery

## 2021-10-29 NOTE — Anesthesia Postprocedure Evaluation (Signed)
Anesthesia Post Note  Patient: Katie Dennis  Procedure(s) Performed: BREAST LUMPECTOMY,RADIO FREQ LOCALIZER,AXILLARY SENTINEL LYMPH NODE BIOPSY (Left)  Patient location during evaluation: PACU Anesthesia Type: General Level of consciousness: awake and alert, oriented and patient cooperative Pain management: pain level controlled Vital Signs Assessment: post-procedure vital signs reviewed and stable Respiratory status: spontaneous breathing, nonlabored ventilation and respiratory function stable Cardiovascular status: blood pressure returned to baseline and stable Postop Assessment: adequate PO intake Anesthetic complications: no   No notable events documented.   Last Vitals:  Vitals:   10/28/21 1441 10/28/21 1457  BP:  (!) 148/85  Pulse: 98 92  Resp: 15 16  Temp: (!) 36.1 C (!) 36.1 C  SpO2: 95% 94%    Last Pain:  Vitals:   10/28/21 1457  TempSrc: Temporal  PainSc: [place]

## 2021-10-31 ENCOUNTER — Encounter: Payer: Self-pay | Admitting: *Deleted

## 2021-10-31 ENCOUNTER — Telehealth: Payer: Self-pay | Admitting: Surgery

## 2021-10-31 DIAGNOSIS — Z09 Encounter for follow-up examination after completed treatment for conditions other than malignant neoplasm: Secondary | ICD-10-CM

## 2021-10-31 LAB — SURGICAL PATHOLOGY

## 2021-10-31 NOTE — Progress Notes (Signed)
Oncotype Dx order submitted online on surgical specimen [phone], Order ID [id number]

## 2021-10-31 NOTE — Telephone Encounter (Signed)
D/W pt about pathology results. SHe is very appreciative and clinically very well w/o concerns

## 2021-11-03 ENCOUNTER — Encounter: Payer: Self-pay | Admitting: *Deleted

## 2021-11-03 DIAGNOSIS — C50912 Malignant neoplasm of unspecified site of left female breast: Secondary | ICD-10-CM

## 2021-11-03 NOTE — Progress Notes (Signed)
Patient referred to Dr. Baruch Gouty, she will see him the same day as Dr. Darrall Dears on 10/9.  Appt. Details given.   No questions at this time.

## 2021-11-07 ENCOUNTER — Encounter: Payer: Self-pay | Admitting: Internal Medicine

## 2021-11-07 DIAGNOSIS — C50912 Malignant neoplasm of unspecified site of left female breast: Secondary | ICD-10-CM | POA: Diagnosis not present

## 2021-11-07 DIAGNOSIS — Z170 Estrogen receptor positive status [ER+]: Secondary | ICD-10-CM | POA: Diagnosis not present

## 2021-11-10 ENCOUNTER — Ambulatory Visit (LOCAL_COMMUNITY_HEALTH_CENTER): Payer: Medicare PPO

## 2021-11-10 ENCOUNTER — Encounter: Payer: Self-pay | Admitting: Internal Medicine

## 2021-11-10 DIAGNOSIS — Z23 Encounter for immunization: Secondary | ICD-10-CM | POA: Diagnosis not present

## 2021-11-10 DIAGNOSIS — Z719 Counseling, unspecified: Secondary | ICD-10-CM

## 2021-11-10 NOTE — Progress Notes (Signed)
  Are you feeling sick today? No   Have you ever received a dose of COVID-19 Vaccine? [employer], [place], [place], [place], Other) Yes  If yes, which vaccine and how many doses?   5   Did you bring the vaccination record card or other documentation?  Yes   Do you have a health condition or are undergoing treatment that makes you moderately or severely immunocompromised? This would include, but not be limited to: cancer, HIV, organ transplant, immunosuppressive therapy/high-dose corticosteroids, or moderate/severe primary immunodeficiency.  No  Have you received COVID-19 vaccine before or during hematopoietic cell transplant (HCT) or CAR-T-cell therapies? No  Have you ever had an allergic reaction to: (This would include a severe allergic reaction or a reaction that caused hives, swelling, or respiratory distress, including wheezing.) A component of a COVID-19 vaccine or a previous dose of COVID-19 vaccine? No   Have you ever had an allergic reaction to another vaccine (other thanCOVID-19 vaccine) or an injectable medication? (This would include a severe allergic reaction or a reaction that caused hives, swelling, or respiratory distress, including wheezing.)   No    Do you have a history of any of the following:  Myocarditis or Pericarditis No  Dermal fillers:  No  Multisystem Inflammatory Syndrome (MIS-C or MIS-A)? No  COVID-19 disease within the past 3 months? Yes  10/20/2021 Discussed current guidelines on waiting 3 months - patient wanted vaccine today.   Vaccinated with monkeypox vaccine in the last 4 weeks? No  COVID/Comirnaty +12Y given IM in right deltoid. Fluzone HD given IM in left deltoid.  Tolerated well. COVID card updated. [facility] updated and copy provided. VIS provided.

## 2021-11-17 ENCOUNTER — Inpatient Hospital Stay: Payer: Medicare PPO | Attending: Internal Medicine | Admitting: Internal Medicine

## 2021-11-17 ENCOUNTER — Encounter: Payer: Self-pay | Admitting: Internal Medicine

## 2021-11-17 ENCOUNTER — Ambulatory Visit
Admission: RE | Admit: 2021-11-17 | Discharge: 2021-11-17 | Disposition: A | Discharge status: Home / Self Care | Payer: Medicare PPO | Source: Ambulatory Visit | Attending: Radiation Oncology | Admitting: Radiation Oncology

## 2021-11-17 VITALS — BP 141/76 | HR 96 | Temp 98.4°F | Wt 147.0 lb

## 2021-11-17 DIAGNOSIS — C50912 Malignant neoplasm of unspecified site of left female breast: Secondary | ICD-10-CM

## 2021-11-17 DIAGNOSIS — C50412 Malignant neoplasm of upper-outer quadrant of left female breast: Secondary | ICD-10-CM | POA: Diagnosis not present

## 2021-11-17 DIAGNOSIS — I10 Essential (primary) hypertension: Secondary | ICD-10-CM | POA: Insufficient documentation

## 2021-11-17 DIAGNOSIS — D509 Iron deficiency anemia, unspecified: Secondary | ICD-10-CM | POA: Diagnosis not present

## 2021-11-17 DIAGNOSIS — Z79899 Other long term (current) drug therapy: Secondary | ICD-10-CM | POA: Insufficient documentation

## 2021-11-17 DIAGNOSIS — Z8616 Personal history of COVID-19: Secondary | ICD-10-CM | POA: Insufficient documentation

## 2021-11-17 DIAGNOSIS — R599 Enlarged lymph nodes, unspecified: Secondary | ICD-10-CM | POA: Insufficient documentation

## 2021-11-17 DIAGNOSIS — M797 Fibromyalgia: Secondary | ICD-10-CM | POA: Insufficient documentation

## 2021-11-17 DIAGNOSIS — Z79811 Long term (current) use of aromatase inhibitors: Secondary | ICD-10-CM | POA: Diagnosis not present

## 2021-11-17 DIAGNOSIS — Z87891 Personal history of nicotine dependence: Secondary | ICD-10-CM | POA: Insufficient documentation

## 2021-11-17 DIAGNOSIS — Z170 Estrogen receptor positive status [ER+]: Secondary | ICD-10-CM | POA: Diagnosis not present

## 2021-11-17 DIAGNOSIS — Z803 Family history of malignant neoplasm of breast: Secondary | ICD-10-CM | POA: Insufficient documentation

## 2021-11-17 DIAGNOSIS — I341 Nonrheumatic mitral (valve) prolapse: Secondary | ICD-10-CM | POA: Insufficient documentation
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
## 2021-11-17 NOTE — Progress Notes (Signed)
Patient here today for follow up visit regarding breast cancer.
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
## 2021-11-18 DIAGNOSIS — C50412 Malignant neoplasm of upper-outer quadrant of left female breast: Secondary | ICD-10-CM | POA: Diagnosis not present

## 2021-11-18 DIAGNOSIS — Z170 Estrogen receptor positive status [ER+]: Secondary | ICD-10-CM | POA: Diagnosis not present

## 2021-11-19 ENCOUNTER — Encounter: Payer: Self-pay | Admitting: Surgery

## 2021-11-19 ENCOUNTER — Ambulatory Visit (INDEPENDENT_AMBULATORY_CARE_PROVIDER_SITE_OTHER): Payer: Medicare PPO | Admitting: Surgery

## 2021-11-19 ENCOUNTER — Other Ambulatory Visit: Payer: Self-pay

## 2021-11-19 VITALS — BP 128/88 | HR 88 | Temp 97.7°F | Ht 61.0 in | Wt 147.0 lb

## 2021-11-19 DIAGNOSIS — C50412 Malignant neoplasm of upper-outer quadrant of left female breast: Secondary | ICD-10-CM

## 2021-11-19 DIAGNOSIS — Z08 Encounter for follow-up examination after completed treatment for malignant neoplasm: Secondary | ICD-10-CM

## 2021-11-19 DIAGNOSIS — Z09 Encounter for follow-up examination after completed treatment for conditions other than malignant neoplasm: Secondary | ICD-10-CM

## 2021-11-19 DIAGNOSIS — Z170 Estrogen receptor positive status [ER+]: Secondary | ICD-10-CM

## 2021-11-19 NOTE — Patient Instructions (Addendum)
Please call with any questions. We will contact you in February to schedule an appointment for March 2024.

## 2021-11-21 ENCOUNTER — Ambulatory Visit
Admission: RE | Admit: 2021-11-21 | Discharge: 2021-11-21 | Disposition: A | Discharge status: Home / Self Care | Payer: Medicare PPO | Source: Ambulatory Visit | Attending: Radiation Oncology | Admitting: Radiation Oncology

## 2021-11-21 DIAGNOSIS — C50412 Malignant neoplasm of upper-outer quadrant of left female breast: Secondary | ICD-10-CM | POA: Insufficient documentation

## 2021-11-21 DIAGNOSIS — Z170 Estrogen receptor positive status [ER+]: Secondary | ICD-10-CM | POA: Insufficient documentation

## 2021-11-21 DIAGNOSIS — Z510 Encounter for antineoplastic radiation therapy: Secondary | ICD-10-CM | POA: Diagnosis not present

## 2021-11-23 NOTE — Progress Notes (Signed)
Katie Dennis is a 65 y.o. 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 Negative Margins. She is doing well from her surgery and has no complaints today.  No fevers no chills.  No drainage.  Pathology again discussed with her in detail. Met with oncology no need chemo.  PE NAD, chaperone present Breast: wound healing well, good contour good cosmetic outcome, pt is please. No infection, seromas or complications  A/P Doing  very well Pt to start XRT RTC 5 months

## 2021-11-25 ENCOUNTER — Inpatient Hospital Stay (HOSPITAL_BASED_OUTPATIENT_CLINIC_OR_DEPARTMENT_OTHER): Payer: Medicare PPO | Admitting: Licensed Clinical Social Worker

## 2021-11-25 ENCOUNTER — Encounter: Payer: Self-pay | Admitting: Licensed Clinical Social Worker

## 2021-11-25 ENCOUNTER — Inpatient Hospital Stay: Payer: Medicare PPO

## 2021-11-25 DIAGNOSIS — Z8042 Family history of malignant neoplasm of prostate: Secondary | ICD-10-CM

## 2021-11-25 DIAGNOSIS — C50912 Malignant neoplasm of unspecified site of left female breast: Secondary | ICD-10-CM | POA: Diagnosis not present

## 2021-11-25 DIAGNOSIS — Z803 Family history of malignant neoplasm of breast: Secondary | ICD-10-CM | POA: Diagnosis not present

## 2021-11-25 DIAGNOSIS — C50412 Malignant neoplasm of upper-outer quadrant of left female breast: Secondary | ICD-10-CM | POA: Diagnosis not present

## 2021-11-25 DIAGNOSIS — Z510 Encounter for antineoplastic radiation therapy: Secondary | ICD-10-CM | POA: Diagnosis not present

## 2021-11-25 DIAGNOSIS — Z170 Estrogen receptor positive status [ER+]: Secondary | ICD-10-CM | POA: Diagnosis not present
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
## 2021-11-27 DIAGNOSIS — Z170 Estrogen receptor positive status [ER+]: Secondary | ICD-10-CM | POA: Diagnosis not present

## 2021-11-27 DIAGNOSIS — C50412 Malignant neoplasm of upper-outer quadrant of left female breast: Secondary | ICD-10-CM | POA: Diagnosis not present

## 2021-11-27 DIAGNOSIS — Z510 Encounter for antineoplastic radiation therapy: Secondary | ICD-10-CM | POA: Diagnosis not present

## 2021-11-28 ENCOUNTER — Other Ambulatory Visit: Payer: Self-pay | Admitting: *Deleted

## 2021-11-28 DIAGNOSIS — C50912 Malignant neoplasm of unspecified site of left female breast: Secondary | ICD-10-CM

## 2021-12-02 ENCOUNTER — Ambulatory Visit
Admission: RE | Admit: 2021-12-02 | Discharge: 2021-12-02 | Disposition: A | Discharge status: Home / Self Care | Payer: Medicare PPO | Source: Ambulatory Visit | Attending: Radiation Oncology | Admitting: Radiation Oncology

## 2021-12-02 DIAGNOSIS — Z510 Encounter for antineoplastic radiation therapy: Secondary | ICD-10-CM | POA: Diagnosis not present

## 2021-12-02 DIAGNOSIS — Z170 Estrogen receptor positive status [ER+]: Secondary | ICD-10-CM | POA: Diagnosis not present

## 2021-12-02 DIAGNOSIS — C50412 Malignant neoplasm of upper-outer quadrant of left female breast: Secondary | ICD-10-CM | POA: Diagnosis not present

## 2021-12-03 ENCOUNTER — Ambulatory Visit
Admission: RE | Admit: 2021-12-03 | Discharge: 2021-12-03 | Disposition: A | Discharge status: Home / Self Care | Payer: Medicare PPO | Source: Ambulatory Visit | Attending: Radiation Oncology | Admitting: Radiation Oncology

## 2021-12-03 ENCOUNTER — Other Ambulatory Visit: Payer: Self-pay

## 2021-12-03 ENCOUNTER — Ambulatory Visit: Payer: Medicare PPO | Admitting: Surgery

## 2021-12-03 DIAGNOSIS — C50412 Malignant neoplasm of upper-outer quadrant of left female breast: Secondary | ICD-10-CM | POA: Diagnosis not present

## 2021-12-03 DIAGNOSIS — Z170 Estrogen receptor positive status [ER+]: Secondary | ICD-10-CM | POA: Diagnosis not present

## 2021-12-03 DIAGNOSIS — Z510 Encounter for antineoplastic radiation therapy: Secondary | ICD-10-CM | POA: Diagnosis not present

## 2021-12-03 LAB — RAD ONC ARIA SESSION SUMMARY
Course Elapsed Days: 0
Plan Fractions Treated to Date: 1
Plan Prescribed Dose Per Fraction: 2.66 Gy
Plan Total Fractions Prescribed: 16
Plan Total Prescribed Dose: 42.56 Gy
Reference Point Dosage Given to Date: 2.66 Gy
Reference Point Session Dosage Given: 2.66 Gy
Session Number: 1

## 2021-12-04 ENCOUNTER — Other Ambulatory Visit: Payer: Self-pay

## 2021-12-04 ENCOUNTER — Encounter: Payer: Self-pay | Admitting: *Deleted

## 2021-12-04 ENCOUNTER — Ambulatory Visit
Admission: RE | Admit: 2021-12-04 | Discharge: 2021-12-04 | Disposition: A | Discharge status: Home / Self Care | Payer: Medicare PPO | Source: Ambulatory Visit | Attending: Radiation Oncology | Admitting: Radiation Oncology

## 2021-12-04 DIAGNOSIS — C50412 Malignant neoplasm of upper-outer quadrant of left female breast: Secondary | ICD-10-CM | POA: Diagnosis not present

## 2021-12-04 DIAGNOSIS — Z510 Encounter for antineoplastic radiation therapy: Secondary | ICD-10-CM | POA: Diagnosis not present

## 2021-12-04 DIAGNOSIS — Z170 Estrogen receptor positive status [ER+]: Secondary | ICD-10-CM | POA: Diagnosis not present

## 2021-12-04 LAB — RAD ONC ARIA SESSION SUMMARY
Course Elapsed Days: 1
Plan Fractions Treated to Date: 2
Plan Prescribed Dose Per Fraction: 2.66 Gy
Plan Total Fractions Prescribed: 16
Plan Total Prescribed Dose: 42.56 Gy
Reference Point Dosage Given to Date: 5.32 Gy
Reference Point Session Dosage Given: 2.66 Gy
Session Number: 2

## 2021-12-05 ENCOUNTER — Other Ambulatory Visit: Payer: Self-pay

## 2021-12-05 ENCOUNTER — Ambulatory Visit
Admission: RE | Admit: 2021-12-05 | Discharge: 2021-12-05 | Disposition: A | Discharge status: Home / Self Care | Payer: Medicare PPO | Source: Ambulatory Visit | Attending: Radiation Oncology | Admitting: Radiation Oncology

## 2021-12-05 DIAGNOSIS — Z170 Estrogen receptor positive status [ER+]: Secondary | ICD-10-CM | POA: Diagnosis not present

## 2021-12-05 DIAGNOSIS — Z510 Encounter for antineoplastic radiation therapy: Secondary | ICD-10-CM | POA: Diagnosis not present

## 2021-12-05 DIAGNOSIS — C50412 Malignant neoplasm of upper-outer quadrant of left female breast: Secondary | ICD-10-CM | POA: Diagnosis not present

## 2021-12-05 LAB — RAD ONC ARIA SESSION SUMMARY
Course Elapsed Days: 2
Plan Fractions Treated to Date: 3
Plan Prescribed Dose Per Fraction: 2.66 Gy
Plan Total Fractions Prescribed: 16
Plan Total Prescribed Dose: 42.56 Gy
Reference Point Dosage Given to Date: 7.98 Gy
Reference Point Session Dosage Given: 2.66 Gy
Session Number: 3

## 2021-12-08 ENCOUNTER — Ambulatory Visit
Admission: RE | Admit: 2021-12-08 | Discharge: 2021-12-08 | Disposition: A | Discharge status: Home / Self Care | Payer: Medicare PPO | Source: Ambulatory Visit | Attending: Radiation Oncology | Admitting: Radiation Oncology

## 2021-12-08 ENCOUNTER — Other Ambulatory Visit: Payer: Self-pay

## 2021-12-08 DIAGNOSIS — C50412 Malignant neoplasm of upper-outer quadrant of left female breast: Secondary | ICD-10-CM | POA: Diagnosis not present

## 2021-12-08 DIAGNOSIS — Z170 Estrogen receptor positive status [ER+]: Secondary | ICD-10-CM | POA: Diagnosis not present

## 2021-12-08 DIAGNOSIS — Z510 Encounter for antineoplastic radiation therapy: Secondary | ICD-10-CM | POA: Diagnosis not present

## 2021-12-08 LAB — RAD ONC ARIA SESSION SUMMARY
Course Elapsed Days: 5
Plan Fractions Treated to Date: 4
Plan Prescribed Dose Per Fraction: 2.66 Gy
Plan Total Fractions Prescribed: 16
Plan Total Prescribed Dose: 42.56 Gy
Reference Point Dosage Given to Date: 10.64 Gy
Reference Point Session Dosage Given: 2.66 Gy
Session Number: 4

## 2021-12-09 ENCOUNTER — Ambulatory Visit
Admission: RE | Admit: 2021-12-09 | Discharge: 2021-12-09 | Disposition: A | Discharge status: Home / Self Care | Payer: Medicare PPO | Source: Ambulatory Visit | Attending: Radiation Oncology | Admitting: Radiation Oncology

## 2021-12-09 ENCOUNTER — Other Ambulatory Visit: Payer: Self-pay | Admitting: Family Medicine

## 2021-12-09 ENCOUNTER — Inpatient Hospital Stay: Payer: Medicare PPO

## 2021-12-09 ENCOUNTER — Other Ambulatory Visit: Payer: Self-pay

## 2021-12-09 DIAGNOSIS — Z510 Encounter for antineoplastic radiation therapy: Secondary | ICD-10-CM | POA: Diagnosis not present

## 2021-12-09 DIAGNOSIS — Z170 Estrogen receptor positive status [ER+]: Secondary | ICD-10-CM | POA: Diagnosis not present

## 2021-12-09 DIAGNOSIS — C50412 Malignant neoplasm of upper-outer quadrant of left female breast: Secondary | ICD-10-CM | POA: Diagnosis not present

## 2021-12-09 DIAGNOSIS — C50912 Malignant neoplasm of unspecified site of left female breast: Secondary | ICD-10-CM

## 2021-12-09 LAB — CBC
HCT: 44.3 % (ref 36.0–46.0)
Hemoglobin: 15.6 g/dL — ABNORMAL HIGH (ref 12.0–15.0)
MCH: 29.9 pg (ref 26.0–34.0)
MCHC: 35.2 g/dL (ref 30.0–36.0)
MCV: 85 fL (ref 80.0–100.0)
Platelets: 213 10*3/uL (ref 150–400)
RBC: 5.21 MIL/uL — ABNORMAL HIGH (ref 3.87–5.11)
RDW: 13.1 % (ref 11.5–15.5)
WBC: 4.7 10*3/uL (ref 4.0–10.5)
nRBC: 0 % (ref 0.0–0.2)

## 2021-12-09 LAB — RAD ONC ARIA SESSION SUMMARY
Course Elapsed Days: 6
Plan Fractions Treated to Date: 5
Plan Prescribed Dose Per Fraction: 2.66 Gy
Plan Total Fractions Prescribed: 16
Plan Total Prescribed Dose: 42.56 Gy
Reference Point Dosage Given to Date: 13.3 Gy
Reference Point Session Dosage Given: 2.66 Gy
Session Number: 5

## 2021-12-09 NOTE — Telephone Encounter (Signed)
Last office visit 08/14/21 for GYN Exam.  Last refilled 09/25/21 for #60 with 1 refill.  No future appointments with PCP.

## 2021-12-10 ENCOUNTER — Ambulatory Visit
Admission: RE | Admit: 2021-12-10 | Discharge: 2021-12-10 | Disposition: A | Discharge status: Home / Self Care | Payer: Medicare PPO | Source: Ambulatory Visit | Attending: Radiation Oncology | Admitting: Radiation Oncology

## 2021-12-10 ENCOUNTER — Other Ambulatory Visit: Payer: Self-pay

## 2021-12-10 DIAGNOSIS — Z170 Estrogen receptor positive status [ER+]: Secondary | ICD-10-CM | POA: Diagnosis not present

## 2021-12-10 DIAGNOSIS — C50412 Malignant neoplasm of upper-outer quadrant of left female breast: Secondary | ICD-10-CM | POA: Insufficient documentation

## 2021-12-10 DIAGNOSIS — Z510 Encounter for antineoplastic radiation therapy: Secondary | ICD-10-CM | POA: Diagnosis not present

## 2021-12-10 LAB — RAD ONC ARIA SESSION SUMMARY
Course Elapsed Days: 7
Plan Fractions Treated to Date: 6
Plan Prescribed Dose Per Fraction: 2.66 Gy
Plan Total Fractions Prescribed: 16
Plan Total Prescribed Dose: 42.56 Gy
Reference Point Dosage Given to Date: 15.96 Gy
Reference Point Session Dosage Given: 2.66 Gy
Session Number: 6

## 2021-12-11 ENCOUNTER — Telehealth: Payer: Self-pay | Admitting: Licensed Clinical Social Worker

## 2021-12-11 ENCOUNTER — Ambulatory Visit: Payer: Self-pay | Admitting: Licensed Clinical Social Worker

## 2021-12-11 ENCOUNTER — Encounter: Payer: Self-pay | Admitting: Licensed Clinical Social Worker

## 2021-12-11 ENCOUNTER — Ambulatory Visit
Admission: RE | Admit: 2021-12-11 | Discharge: 2021-12-11 | Disposition: A | Discharge status: Home / Self Care | Payer: Medicare PPO | Source: Ambulatory Visit | Attending: Radiation Oncology | Admitting: Radiation Oncology

## 2021-12-11 ENCOUNTER — Other Ambulatory Visit: Payer: Self-pay

## 2021-12-11 DIAGNOSIS — Z170 Estrogen receptor positive status [ER+]: Secondary | ICD-10-CM | POA: Diagnosis not present

## 2021-12-11 DIAGNOSIS — Z1379 Encounter for other screening for genetic and chromosomal anomalies: Secondary | ICD-10-CM

## 2021-12-11 DIAGNOSIS — C50412 Malignant neoplasm of upper-outer quadrant of left female breast: Secondary | ICD-10-CM | POA: Diagnosis not present

## 2021-12-11 DIAGNOSIS — Z510 Encounter for antineoplastic radiation therapy: Secondary | ICD-10-CM | POA: Diagnosis not present

## 2021-12-11 LAB — RAD ONC ARIA SESSION SUMMARY
Course Elapsed Days: 8
Plan Fractions Treated to Date: 7
Plan Prescribed Dose Per Fraction: 2.66 Gy
Plan Total Fractions Prescribed: 16
Plan Total Prescribed Dose: 42.56 Gy
Reference Point Dosage Given to Date: 18.62 Gy
Reference Point Session Dosage Given: 2.66 Gy
Session Number: 7

## 2021-12-11 NOTE — Progress Notes (Signed)
HPI:   [name] was previously seen in the [facility] clinic due to a personal and family history of cancer and concerns regarding a hereditary predisposition to cancer. Please refer to our prior cancer genetics clinic note for more information regarding our discussion, assessment and recommendations, at the time. [name] recent genetic test results were disclosed to her, as were recommendations warranted by these results. These results and recommendations are discussed in more detail below.  CANCER HISTORY:  Oncology History  Invasive ductal carcinoma of left breast ([place])  09/04/2021 Mammogram   Screening mammogram FINDINGS: In the left breast, a possible asymmetry warrants further evaluation. In the right breast, no findings suspicious for malignancy.  Diagnostic mammogram and targeted US 09/09/2021 Targeted ultrasound was performed of the LEFT upper outer breast. At 1:30 8 cm from nipple, there is an irregular hypoechoic mass with irregular margins. It measures 5 x 4 x 5 mm. This is favored to correspond to the site of screening mammographic concern.   There is a mildly enlarged LEFT axillary lymph node demonstrating cortical thickness of 4 mm.     09/25/2021 Initial Biopsy   Site A. BREAST MASS, LEFT 1:30 8 CM FN; ULTRASOUND-GUIDED BIOPSY: - INVASIVE MAMMARY CARCINOMA, NO SPECIAL TYPE.  Size of invasive carcinoma: 7 mm in this sample  Histologic grade of invasive carcinoma: Grade 2                       Glandular/tubular differentiation score: 3                       Nuclear pleomorphism score: 2                       Mitotic rate score: 1                       Total score: 6  Ductal carcinoma in situ: Not identified  Lymphovascular invasion: Not identified    Site B. LYMPH NODE, LEFT AXILLA; ULTRASOUND-GUIDED BIOPSY: - CHANGES CONSISTENT WITH DERMATOPATHIC LYMPHADENITIS. - NEGATIVE FOR MALIGNANCY.  ER >90% positive  PR >90% positive HER2 negative Ki67 not  performed.      09/25/2021 Cancer Staging   Staging form: Breast, AJCC 8th Edition - Clinical stage from 09/25/2021: Stage IA (cT1b, cN0(f), cM0, G2, ER+, PR+, HER2-) - Signed by Katie Canary, MD on 10/01/2021 Stage prefix: Initial diagnosis Method of lymph node assessment: Core biopsy Nuclear grade: G2 Mitotic count score: Score 1 Histologic grading system: 3 grade system   10/28/2021 Surgery   Left breast lumpectomy and SLNB 4 lymph nodes on 10/28/2021 by Dr. Dahlia Dennis   10/28/2021 Pathology Results   DIAGNOSIS:  A.  BREAST, LEFT; LUMPECTOMY:  - INVASIVE MAMMARY CARCINOMA OF NO SPECIAL TYPE, 8 MM, MARGINS NEGATIVE  (SEE BELOW)  B: LEFT AXILLARY SENTINEL NODES 1 THROUGH 4; EXCISION:  - NO EVIDENCE OF METASTATIC CARCINOMA IN 4 LYMPH NODES.   CANCER CASE SUMMARY: INVASIVE CARCINOMA OF THE BREAST  Standard(s): AJCC-UICC 8   SPECIMEN  Procedure: Lumpectomy and sentinel node  Specimen Laterality: Left   TUMOR  Histologic Type: Invasive carcinoma of no special type  Histologic Grade (Nottingham Histologic Score)       Glandular (Acinar)/Tubular Differentiation: 3 (of 3)       Nuclear Pleomorphism: 2 (of 3)       Mitotic Rate: 1 (of 3)  Overall Grade: 2 (of 3)  Tumor Size: 8 mm  Tumor Focality: Unifocal  Ductal Carcinoma In Situ (DCIS): Rare foci, present  Tumor Extent: Not applicable  Lymphatic and/or Vascular Invasion: Not identified  Treatment Effect in the Breast: Not identified   MARGINS  Margin Status for Invasive Carcinoma: All margins negative for invasive  carcinoma       Distance from closest margin: 5  mm       Specify closest margin: Anterior   Margin Status for DCIS: All margins negative for DCIS       Distance from DCIS to closest margin: 12 mm       Specify closest margin: Anterior   REGIONAL LYMPH NODES  Regional Lymph Node Status: All regional lymph nodes negative for tumor       Total Number of Lymph Nodes Examined (sentinel and non-sentinel): 4         Number of Sentinel Nodes Examined: 4     Genetic Testing   Negative genetic testing. No pathogenic variants identified on the [facility] Common Hereditary Cancers+RNA panel. The report date is 12/10/2021.  The Common Hereditary Cancers Panel + RNA offered by [facility] includes sequencing and/or deletion duplication testing of the following 47 genes: APC, ATM, AXIN2, BARD1, BMPR1A, BRCA1, BRCA2, BRIP1, CDH1, CDKN2A (p14ARF), CDKN2A (p16INK4a), CKD4, CHEK2, CTNNA1, DICER1, EPCAM (Deletion/duplication testing only), GREM1 (promoter region deletion/duplication testing only), KIT, MEN1, MLH1, MSH2, MSH3, MSH6, MUTYH, NBN, NF1, NHTL1, PALB2, PDGFRA, PMS2, POLD1, POLE, PTEN, RAD50, RAD51C, RAD51D, SDHB, SDHC, SDHD, SMAD4, SMARCA4. STK11, TP53, TSC1, TSC2, and VHL.  The following genes were evaluated for sequence changes only: SDHA and HOXB13 c.251G>A variant only.     FAMILY HISTORY:  We obtained a detailed, 4-generation family history.  Significant diagnoses are listed below: Family History  Problem Relation Age of Onset   Hypertension Mother    Dementia Mother    Frontotemporal dementia Mother    Meniere's disease Father    Breast cancer Maternal Aunt 41       inflammatory   Coronary artery disease Maternal Grandfather    Hypertension Maternal Grandfather    Prostate cancer Paternal Grandfather        d. late 88s   Breast cancer Cousin 7   Diabetes Neg Hx    [name] has 1 daughter, 40. She has 1 sister, [place], 30.    [name] mother passed at 55. Patient had 1 maternal aunt who had inflammatory breast cancer at 69 and passed at 27. An uncle's daughter had breast cancer as well at 55, possibly inflammatory, and passed at 4.    [name] father is living at 49. A paternal aunt may have had breast cancer. Paternal grandfather had prostate cancer and passed in his late 35s.   [name] is unaware of previous family history of genetic testing for hereditary cancer risks. There is no  reported Ashkenazi Jewish ancestry. There is no known consanguinity.      GENETIC TEST RESULTS:  The [facility] Common Hereditary Cancers+RNA Panel found no pathogenic mutations.   The Common Hereditary Cancers Panel + RNA offered by [facility] includes sequencing and/or deletion duplication testing of the following 47 genes: APC, ATM, AXIN2, BARD1, BMPR1A, BRCA1, BRCA2, BRIP1, CDH1, CDKN2A (p14ARF), CDKN2A (p16INK4a), CKD4, CHEK2, CTNNA1, DICER1, EPCAM (Deletion/duplication testing only), GREM1 (promoter region deletion/duplication testing only), KIT, MEN1, MLH1, MSH2, MSH3, MSH6, MUTYH, NBN, NF1, NHTL1, PALB2, PDGFRA, PMS2, POLD1, POLE, PTEN, RAD50, RAD51C, RAD51D, SDHB, SDHC, SDHD, SMAD4, SMARCA4. STK11,  TP53, TSC1, TSC2, and VHL.  The following genes were evaluated for sequence changes only: SDHA and HOXB13 c.251G>A variant only.   The test report has been scanned into EPIC and is located under the Molecular Pathology section of the Results Review tab.  A portion of the result report is included below for reference. Genetic testing reported out on 12/10/2021.      Even though a pathogenic variant was not identified, possible explanations for the cancer in the family may include: There may be no hereditary risk for cancer in the family. The cancers in [name] and/or her family may be sporadic/familial or due to other genetic and environmental factors. There may be a gene mutation in one of these genes that current testing methods cannot detect but that chance is small. There could be another gene that has not yet been discovered, or that we have not yet tested, that is responsible for the cancer diagnoses in the family.  It is also possible there is a hereditary cause for the cancer in the family that [name] did not inherit.  Therefore, it is important to remain in touch with cancer genetics in the future so that we can continue to offer [name] the most up to date genetic testing.    ADDITIONAL GENETIC TESTING:  We discussed with [name] that her genetic testing was fairly extensive.  If there are additional relevant genes identified to increase cancer risk that can be analyzed in the future, we would be happy to discuss and coordinate this testing at that time.    CANCER SCREENING RECOMMENDATIONS:  [name] test result is considered negative (normal).  This means that we have not identified a hereditary cause for her personal and family history of cancer at this time.   An individual's cancer risk and medical management are not determined by genetic test results alone. Overall cancer risk assessment incorporates additional factors, including personal medical history, family history, and any available genetic information that may result in a personalized plan for cancer prevention and surveillance. Therefore, it is recommended she continue to follow the cancer management and screening guidelines provided by her oncology and primary healthcare provider.  RECOMMENDATIONS FOR FAMILY MEMBERS:   Since she did not inherit a identifiable mutation in a cancer predisposition gene included on this panel, her children could not have inherited a known mutation from her in one of these genes. Individuals in this family might be at some increased risk of developing cancer, over the general population risk, due to the family history of cancer.  Individuals in the family should notify their providers of the family history of cancer. We recommend women in this family have a yearly mammogram beginning at age 14, or 31 years younger than the earliest onset of cancer, an annual clinical breast exam, and perform monthly breast self-exams.  Family members should have colonoscopies by at age 36, or earlier, as recommended by their providers. Other members of the family may still carry a pathogenic variant in one of these genes that Ms. Bennis did not inherit. Based on the family history, we  recommend those related to her cousin and aunt who had young onset breast cancer have genetic counseling and testing. Ms. Kuwahara will let us know if we can be of any assistance in coordinating genetic counseling and/or testing for this family member.    FOLLOW-UP:  Lastly, we discussed with [name] that cancer genetics is a rapidly advancing field and it is possible  that new genetic tests will be appropriate for her and/or her family members in the future. We encouraged her to remain in contact with cancer genetics on an annual basis so we can update her personal and family histories and let her know of advances in cancer genetics that may benefit this family.   Our contact number was provided. [name] questions were answered to her satisfaction, and she knows she is welcome to call us at anytime with additional questions or concerns.    Faith Rogue, MS, [email or web address] Phone: [phone]

## 2021-12-11 NOTE — Telephone Encounter (Signed)
I contacted Ms. Chamblee to discuss her genetic testing results. No pathogenic variants were identified in the 47 genes analyzed. Detailed clinic note to follow.   The test report has been scanned into EPIC and is located under the Molecular Pathology section of the Results Review tab.  A portion of the result report is included below for reference.      Faith Rogue, MS, [facility] Genetic Counselor [email or web address] Phone: [phone]

## 2021-12-12 ENCOUNTER — Other Ambulatory Visit: Payer: Self-pay

## 2021-12-12 ENCOUNTER — Ambulatory Visit
Admission: RE | Admit: 2021-12-12 | Discharge: 2021-12-12 | Disposition: A | Discharge status: Home / Self Care | Payer: Medicare PPO | Source: Ambulatory Visit | Attending: Radiation Oncology | Admitting: Radiation Oncology

## 2021-12-12 DIAGNOSIS — Z510 Encounter for antineoplastic radiation therapy: Secondary | ICD-10-CM | POA: Diagnosis not present

## 2021-12-12 DIAGNOSIS — Z170 Estrogen receptor positive status [ER+]: Secondary | ICD-10-CM | POA: Diagnosis not present

## 2021-12-12 DIAGNOSIS — C50412 Malignant neoplasm of upper-outer quadrant of left female breast: Secondary | ICD-10-CM | POA: Diagnosis not present

## 2021-12-12 LAB — RAD ONC ARIA SESSION SUMMARY
Course Elapsed Days: 9
Plan Fractions Treated to Date: 8
Plan Prescribed Dose Per Fraction: 2.66 Gy
Plan Total Fractions Prescribed: 16
Plan Total Prescribed Dose: 42.56 Gy
Reference Point Dosage Given to Date: 21.28 Gy
Reference Point Session Dosage Given: 2.66 Gy
Session Number: 8

## 2021-12-15 ENCOUNTER — Other Ambulatory Visit: Payer: Self-pay

## 2021-12-15 ENCOUNTER — Ambulatory Visit
Admission: RE | Admit: 2021-12-15 | Discharge: 2021-12-15 | Disposition: A | Discharge status: Home / Self Care | Payer: Medicare PPO | Source: Ambulatory Visit | Attending: Radiation Oncology | Admitting: Radiation Oncology

## 2021-12-15 DIAGNOSIS — C50412 Malignant neoplasm of upper-outer quadrant of left female breast: Secondary | ICD-10-CM | POA: Diagnosis not present

## 2021-12-15 DIAGNOSIS — Z170 Estrogen receptor positive status [ER+]: Secondary | ICD-10-CM | POA: Diagnosis not present

## 2021-12-15 DIAGNOSIS — Z510 Encounter for antineoplastic radiation therapy: Secondary | ICD-10-CM | POA: Diagnosis not present

## 2021-12-15 LAB — RAD ONC ARIA SESSION SUMMARY
Course Elapsed Days: 12
Plan Fractions Treated to Date: 9
Plan Prescribed Dose Per Fraction: 2.66 Gy
Plan Total Fractions Prescribed: 16
Plan Total Prescribed Dose: 42.56 Gy
Reference Point Dosage Given to Date: 23.94 Gy
Reference Point Session Dosage Given: 2.66 Gy
Session Number: 9

## 2021-12-16 ENCOUNTER — Other Ambulatory Visit: Payer: Self-pay

## 2021-12-16 ENCOUNTER — Ambulatory Visit
Admission: RE | Admit: 2021-12-16 | Discharge: 2021-12-16 | Disposition: A | Discharge status: Home / Self Care | Payer: Medicare PPO | Source: Ambulatory Visit | Attending: Radiation Oncology | Admitting: Radiation Oncology

## 2021-12-16 DIAGNOSIS — Z510 Encounter for antineoplastic radiation therapy: Secondary | ICD-10-CM | POA: Diagnosis not present

## 2021-12-16 DIAGNOSIS — Z170 Estrogen receptor positive status [ER+]: Secondary | ICD-10-CM | POA: Diagnosis not present

## 2021-12-16 DIAGNOSIS — C50412 Malignant neoplasm of upper-outer quadrant of left female breast: Secondary | ICD-10-CM | POA: Diagnosis not present

## 2021-12-16 LAB — RAD ONC ARIA SESSION SUMMARY
Course Elapsed Days: 13
Plan Fractions Treated to Date: 10
Plan Prescribed Dose Per Fraction: 2.66 Gy
Plan Total Fractions Prescribed: 16
Plan Total Prescribed Dose: 42.56 Gy
Reference Point Dosage Given to Date: 26.6 Gy
Reference Point Session Dosage Given: 2.66 Gy
Session Number: 10

## 2021-12-17 ENCOUNTER — Other Ambulatory Visit: Payer: Self-pay

## 2021-12-17 ENCOUNTER — Ambulatory Visit
Admission: RE | Admit: 2021-12-17 | Discharge: 2021-12-17 | Disposition: A | Discharge status: Home / Self Care | Payer: Medicare PPO | Source: Ambulatory Visit | Attending: Radiation Oncology | Admitting: Radiation Oncology

## 2021-12-17 DIAGNOSIS — C50412 Malignant neoplasm of upper-outer quadrant of left female breast: Secondary | ICD-10-CM | POA: Diagnosis not present

## 2021-12-17 DIAGNOSIS — Z170 Estrogen receptor positive status [ER+]: Secondary | ICD-10-CM | POA: Diagnosis not present

## 2021-12-17 DIAGNOSIS — Z510 Encounter for antineoplastic radiation therapy: Secondary | ICD-10-CM | POA: Diagnosis not present

## 2021-12-17 LAB — RAD ONC ARIA SESSION SUMMARY
Course Elapsed Days: 14
Plan Fractions Treated to Date: 11
Plan Prescribed Dose Per Fraction: 2.66 Gy
Plan Total Fractions Prescribed: 16
Plan Total Prescribed Dose: 42.56 Gy
Reference Point Dosage Given to Date: 29.26 Gy
Reference Point Session Dosage Given: 2.66 Gy
Session Number: 11

## 2021-12-18 ENCOUNTER — Ambulatory Visit
Admission: RE | Admit: 2021-12-18 | Discharge: 2021-12-18 | Disposition: A | Discharge status: Home / Self Care | Payer: Medicare PPO | Source: Ambulatory Visit | Attending: Radiation Oncology | Admitting: Radiation Oncology

## 2021-12-18 ENCOUNTER — Other Ambulatory Visit: Payer: Self-pay

## 2021-12-18 DIAGNOSIS — Z510 Encounter for antineoplastic radiation therapy: Secondary | ICD-10-CM | POA: Diagnosis not present

## 2021-12-18 DIAGNOSIS — Z170 Estrogen receptor positive status [ER+]: Secondary | ICD-10-CM | POA: Diagnosis not present

## 2021-12-18 DIAGNOSIS — C50412 Malignant neoplasm of upper-outer quadrant of left female breast: Secondary | ICD-10-CM | POA: Diagnosis not present

## 2021-12-18 LAB — RAD ONC ARIA SESSION SUMMARY
Course Elapsed Days: 15
Plan Fractions Treated to Date: 12
Plan Prescribed Dose Per Fraction: 2.66 Gy
Plan Total Fractions Prescribed: 16
Plan Total Prescribed Dose: 42.56 Gy
Reference Point Dosage Given to Date: 31.92 Gy
Reference Point Session Dosage Given: 2.66 Gy
Session Number: 12

## 2021-12-19 ENCOUNTER — Ambulatory Visit
Admission: RE | Admit: 2021-12-19 | Discharge: 2021-12-19 | Disposition: A | Discharge status: Home / Self Care | Payer: Medicare PPO | Source: Ambulatory Visit | Attending: Radiation Oncology | Admitting: Radiation Oncology

## 2021-12-19 ENCOUNTER — Other Ambulatory Visit: Payer: Self-pay

## 2021-12-19 DIAGNOSIS — C50412 Malignant neoplasm of upper-outer quadrant of left female breast: Secondary | ICD-10-CM | POA: Diagnosis not present

## 2021-12-19 DIAGNOSIS — Z170 Estrogen receptor positive status [ER+]: Secondary | ICD-10-CM | POA: Diagnosis not present

## 2021-12-19 DIAGNOSIS — Z510 Encounter for antineoplastic radiation therapy: Secondary | ICD-10-CM | POA: Diagnosis not present

## 2021-12-19 LAB — RAD ONC ARIA SESSION SUMMARY
Course Elapsed Days: 16
Plan Fractions Treated to Date: 13
Plan Prescribed Dose Per Fraction: 2.66 Gy
Plan Total Fractions Prescribed: 16
Plan Total Prescribed Dose: 42.56 Gy
Reference Point Dosage Given to Date: 34.58 Gy
Reference Point Session Dosage Given: 2.66 Gy
Session Number: 13

## 2021-12-22 ENCOUNTER — Ambulatory Visit
Admission: RE | Admit: 2021-12-22 | Discharge: 2021-12-22 | Disposition: A | Discharge status: Home / Self Care | Payer: Medicare PPO | Source: Ambulatory Visit | Attending: Radiation Oncology | Admitting: Radiation Oncology

## 2021-12-22 ENCOUNTER — Other Ambulatory Visit: Payer: Self-pay

## 2021-12-22 DIAGNOSIS — Z510 Encounter for antineoplastic radiation therapy: Secondary | ICD-10-CM | POA: Diagnosis not present

## 2021-12-22 DIAGNOSIS — Z170 Estrogen receptor positive status [ER+]: Secondary | ICD-10-CM | POA: Diagnosis not present

## 2021-12-22 DIAGNOSIS — C50412 Malignant neoplasm of upper-outer quadrant of left female breast: Secondary | ICD-10-CM | POA: Diagnosis not present

## 2021-12-22 LAB — RAD ONC ARIA SESSION SUMMARY
Course Elapsed Days: 19
Plan Fractions Treated to Date: 14
Plan Prescribed Dose Per Fraction: 2.66 Gy
Plan Total Fractions Prescribed: 16
Plan Total Prescribed Dose: 42.56 Gy
Reference Point Dosage Given to Date: 37.24 Gy
Reference Point Session Dosage Given: 2.66 Gy
Session Number: 14

## 2021-12-23 ENCOUNTER — Inpatient Hospital Stay: Payer: Medicare PPO

## 2021-12-23 ENCOUNTER — Other Ambulatory Visit: Payer: Self-pay

## 2021-12-23 ENCOUNTER — Ambulatory Visit
Admission: RE | Admit: 2021-12-23 | Discharge: 2021-12-23 | Disposition: A | Discharge status: Home / Self Care | Payer: Medicare PPO | Source: Ambulatory Visit | Attending: Radiation Oncology | Admitting: Radiation Oncology

## 2021-12-23 DIAGNOSIS — Z170 Estrogen receptor positive status [ER+]: Secondary | ICD-10-CM | POA: Insufficient documentation

## 2021-12-23 DIAGNOSIS — C50412 Malignant neoplasm of upper-outer quadrant of left female breast: Secondary | ICD-10-CM | POA: Diagnosis not present

## 2021-12-23 DIAGNOSIS — Z510 Encounter for antineoplastic radiation therapy: Secondary | ICD-10-CM | POA: Diagnosis not present

## 2021-12-23 DIAGNOSIS — C50912 Malignant neoplasm of unspecified site of left female breast: Secondary | ICD-10-CM

## 2021-12-23 LAB — CBC
HCT: 46.8 % — ABNORMAL HIGH (ref 36.0–46.0)
Hemoglobin: 16.2 g/dL — ABNORMAL HIGH (ref 12.0–15.0)
MCH: 29.8 pg (ref 26.0–34.0)
MCHC: 34.6 g/dL (ref 30.0–36.0)
MCV: 86.2 fL (ref 80.0–100.0)
Platelets: 237 10*3/uL (ref 150–400)
RBC: 5.43 MIL/uL — ABNORMAL HIGH (ref 3.87–5.11)
RDW: 13.2 % (ref 11.5–15.5)
WBC: 5.9 10*3/uL (ref 4.0–10.5)
nRBC: 0 % (ref 0.0–0.2)

## 2021-12-23 LAB — RAD ONC ARIA SESSION SUMMARY
Course Elapsed Days: 20
Plan Fractions Treated to Date: 15
Plan Prescribed Dose Per Fraction: 2.66 Gy
Plan Total Fractions Prescribed: 16
Plan Total Prescribed Dose: 42.56 Gy
Reference Point Dosage Given to Date: 39.9 Gy
Reference Point Session Dosage Given: 2.66 Gy
Session Number: 15

## 2021-12-24 ENCOUNTER — Ambulatory Visit
Admission: RE | Admit: 2021-12-24 | Discharge: 2021-12-24 | Disposition: A | Discharge status: Home / Self Care | Payer: Medicare PPO | Source: Ambulatory Visit | Attending: Radiation Oncology | Admitting: Radiation Oncology

## 2021-12-24 ENCOUNTER — Other Ambulatory Visit: Payer: Self-pay

## 2021-12-24 ENCOUNTER — Ambulatory Visit: Admission: RE | Admit: 2021-12-24 | Payer: Medicare PPO | Source: Ambulatory Visit

## 2021-12-24 DIAGNOSIS — C50412 Malignant neoplasm of upper-outer quadrant of left female breast: Secondary | ICD-10-CM | POA: Diagnosis not present

## 2021-12-24 DIAGNOSIS — Z170 Estrogen receptor positive status [ER+]: Secondary | ICD-10-CM | POA: Diagnosis not present

## 2021-12-24 DIAGNOSIS — Z510 Encounter for antineoplastic radiation therapy: Secondary | ICD-10-CM | POA: Diagnosis not present

## 2021-12-24 LAB — RAD ONC ARIA SESSION SUMMARY
Course Elapsed Days: 21
Plan Fractions Treated to Date: 16
Plan Prescribed Dose Per Fraction: 2.66 Gy
Plan Total Fractions Prescribed: 16
Plan Total Prescribed Dose: 42.56 Gy
Reference Point Dosage Given to Date: 42.56 Gy
Reference Point Session Dosage Given: 2.66 Gy
Session Number: 16

## 2021-12-25 ENCOUNTER — Other Ambulatory Visit: Payer: Self-pay

## 2021-12-25 ENCOUNTER — Ambulatory Visit
Admission: RE | Admit: 2021-12-25 | Discharge: 2021-12-25 | Disposition: A | Discharge status: Home / Self Care | Payer: Medicare PPO | Source: Ambulatory Visit | Attending: Radiation Oncology | Admitting: Radiation Oncology

## 2021-12-25 DIAGNOSIS — Z510 Encounter for antineoplastic radiation therapy: Secondary | ICD-10-CM | POA: Diagnosis not present

## 2021-12-25 DIAGNOSIS — Z170 Estrogen receptor positive status [ER+]: Secondary | ICD-10-CM | POA: Diagnosis not present

## 2021-12-25 DIAGNOSIS — C50412 Malignant neoplasm of upper-outer quadrant of left female breast: Secondary | ICD-10-CM | POA: Diagnosis not present

## 2021-12-25 LAB — RAD ONC ARIA SESSION SUMMARY
Course Elapsed Days: 22
Plan Fractions Treated to Date: 1
Plan Prescribed Dose Per Fraction: 2 Gy
Plan Total Fractions Prescribed: 5
Plan Total Prescribed Dose: 10 Gy
Reference Point Dosage Given to Date: 2 Gy
Reference Point Session Dosage Given: 2 Gy
Session Number: 17

## 2021-12-26 ENCOUNTER — Ambulatory Visit
Admission: RE | Admit: 2021-12-26 | Discharge: 2021-12-26 | Disposition: A | Discharge status: Home / Self Care | Payer: Medicare PPO | Source: Ambulatory Visit | Attending: Radiation Oncology | Admitting: Radiation Oncology

## 2021-12-26 ENCOUNTER — Other Ambulatory Visit: Payer: Self-pay

## 2021-12-26 DIAGNOSIS — C50412 Malignant neoplasm of upper-outer quadrant of left female breast: Secondary | ICD-10-CM | POA: Diagnosis not present

## 2021-12-26 DIAGNOSIS — Z170 Estrogen receptor positive status [ER+]: Secondary | ICD-10-CM | POA: Diagnosis not present

## 2021-12-26 DIAGNOSIS — Z510 Encounter for antineoplastic radiation therapy: Secondary | ICD-10-CM | POA: Diagnosis not present

## 2021-12-26 LAB — RAD ONC ARIA SESSION SUMMARY
Course Elapsed Days: 23
Plan Fractions Treated to Date: 2
Plan Prescribed Dose Per Fraction: 2 Gy
Plan Total Fractions Prescribed: 5
Plan Total Prescribed Dose: 10 Gy
Reference Point Dosage Given to Date: 4 Gy
Reference Point Session Dosage Given: 2 Gy
Session Number: 18

## 2021-12-29 ENCOUNTER — Ambulatory Visit
Admission: RE | Admit: 2021-12-29 | Discharge: 2021-12-29 | Disposition: A | Discharge status: Home / Self Care | Payer: Medicare PPO | Source: Ambulatory Visit | Attending: Radiation Oncology | Admitting: Radiation Oncology

## 2021-12-29 ENCOUNTER — Other Ambulatory Visit: Payer: Self-pay | Admitting: Internal Medicine

## 2021-12-29 ENCOUNTER — Other Ambulatory Visit: Payer: Self-pay

## 2021-12-29 DIAGNOSIS — Z170 Estrogen receptor positive status [ER+]: Secondary | ICD-10-CM | POA: Diagnosis not present

## 2021-12-29 DIAGNOSIS — Z510 Encounter for antineoplastic radiation therapy: Secondary | ICD-10-CM | POA: Diagnosis not present

## 2021-12-29 DIAGNOSIS — C50412 Malignant neoplasm of upper-outer quadrant of left female breast: Secondary | ICD-10-CM | POA: Diagnosis not present

## 2021-12-29 LAB — RAD ONC ARIA SESSION SUMMARY
Course Elapsed Days: 26
Plan Fractions Treated to Date: 3
Plan Prescribed Dose Per Fraction: 2 Gy
Plan Total Fractions Prescribed: 5
Plan Total Prescribed Dose: 10 Gy
Reference Point Dosage Given to Date: 6 Gy
Reference Point Session Dosage Given: 2 Gy
Session Number: 19

## 2021-12-29 MED ORDER — LETROZOLE 2.5 MG PO TABS: 2.5000 mg | ORAL_TABLET | Freq: Every day | ORAL | 2 refills | Status: DC

## 2021-12-29 NOTE — Progress Notes (Signed)
Patient is completing radiation soon.  Sent script for Letrozole 2.5 mg daily.

## 2021-12-30 ENCOUNTER — Ambulatory Visit
Admission: RE | Admit: 2021-12-30 | Discharge: 2021-12-30 | Disposition: A | Discharge status: Home / Self Care | Payer: Medicare PPO | Source: Ambulatory Visit | Attending: Radiation Oncology | Admitting: Radiation Oncology

## 2021-12-30 ENCOUNTER — Other Ambulatory Visit: Payer: Self-pay

## 2021-12-30 DIAGNOSIS — Z170 Estrogen receptor positive status [ER+]: Secondary | ICD-10-CM | POA: Diagnosis not present

## 2021-12-30 DIAGNOSIS — Z510 Encounter for antineoplastic radiation therapy: Secondary | ICD-10-CM | POA: Diagnosis not present

## 2021-12-30 DIAGNOSIS — C50412 Malignant neoplasm of upper-outer quadrant of left female breast: Secondary | ICD-10-CM | POA: Diagnosis not present

## 2021-12-30 LAB — RAD ONC ARIA SESSION SUMMARY
Course Elapsed Days: 27
Plan Fractions Treated to Date: 4
Plan Prescribed Dose Per Fraction: 2 Gy
Plan Total Fractions Prescribed: 5
Plan Total Prescribed Dose: 10 Gy
Reference Point Dosage Given to Date: 8 Gy
Reference Point Session Dosage Given: 2 Gy
Session Number: 20

## 2021-12-31 ENCOUNTER — Other Ambulatory Visit: Payer: Self-pay

## 2021-12-31 ENCOUNTER — Encounter: Payer: Self-pay | Admitting: *Deleted

## 2021-12-31 ENCOUNTER — Ambulatory Visit
Admission: RE | Admit: 2021-12-31 | Discharge: 2021-12-31 | Disposition: A | Discharge status: Home / Self Care | Payer: Medicare PPO | Source: Ambulatory Visit | Attending: Radiation Oncology | Admitting: Radiation Oncology

## 2021-12-31 DIAGNOSIS — C50412 Malignant neoplasm of upper-outer quadrant of left female breast: Secondary | ICD-10-CM | POA: Diagnosis not present

## 2021-12-31 DIAGNOSIS — Z510 Encounter for antineoplastic radiation therapy: Secondary | ICD-10-CM | POA: Diagnosis not present

## 2021-12-31 DIAGNOSIS — Z170 Estrogen receptor positive status [ER+]: Secondary | ICD-10-CM | POA: Diagnosis not present

## 2021-12-31 LAB — RAD ONC ARIA SESSION SUMMARY
Course Elapsed Days: 28
Plan Fractions Treated to Date: 5
Plan Prescribed Dose Per Fraction: 2 Gy
Plan Total Fractions Prescribed: 5
Plan Total Prescribed Dose: 10 Gy
Reference Point Dosage Given to Date: 10 Gy
Reference Point Session Dosage Given: 2 Gy
Session Number: 21

## 2022-01-03 ENCOUNTER — Other Ambulatory Visit: Payer: Self-pay | Admitting: Family Medicine

## 2022-01-04 NOTE — Telephone Encounter (Signed)
Last office visit 08/14/21 for GYN Exam.  Last refilled 05/27/21 for #90 with no refills.  No future appointments with PCP.

## 2022-01-12 ENCOUNTER — Ambulatory Visit: Payer: Medicare PPO | Admitting: Internal Medicine

## 2022-01-20 ENCOUNTER — Ambulatory Visit
Admission: RE | Admit: 2022-01-20 | Discharge: 2022-01-20 | Disposition: A | Discharge status: Home / Self Care | Payer: Medicare PPO | Source: Ambulatory Visit | Attending: Internal Medicine | Admitting: Internal Medicine

## 2022-01-20 DIAGNOSIS — Z13820 Encounter for screening for osteoporosis: Secondary | ICD-10-CM | POA: Insufficient documentation

## 2022-01-20 DIAGNOSIS — M797 Fibromyalgia: Secondary | ICD-10-CM | POA: Insufficient documentation

## 2022-01-20 DIAGNOSIS — Z780 Asymptomatic menopausal state: Secondary | ICD-10-CM | POA: Insufficient documentation

## 2022-01-20 DIAGNOSIS — Z79811 Long term (current) use of aromatase inhibitors: Secondary | ICD-10-CM | POA: Insufficient documentation

## 2022-01-20 DIAGNOSIS — M8589 Other specified disorders of bone density and structure, multiple sites: Secondary | ICD-10-CM | POA: Insufficient documentation

## 2022-01-22 ENCOUNTER — Inpatient Hospital Stay: Payer: Medicare PPO

## 2022-01-22 ENCOUNTER — Inpatient Hospital Stay: Payer: Medicare PPO | Attending: Internal Medicine | Admitting: Internal Medicine

## 2022-01-22 ENCOUNTER — Encounter: Payer: Self-pay | Admitting: Internal Medicine

## 2022-01-22 VITALS — BP 132/81 | HR 91 | Temp 98.6°F | Resp 19 | Wt 148.7 lb

## 2022-01-22 DIAGNOSIS — Z5181 Encounter for therapeutic drug level monitoring: Secondary | ICD-10-CM | POA: Diagnosis not present

## 2022-01-22 DIAGNOSIS — E785 Hyperlipidemia, unspecified: Secondary | ICD-10-CM | POA: Diagnosis not present

## 2022-01-22 DIAGNOSIS — D509 Iron deficiency anemia, unspecified: Secondary | ICD-10-CM | POA: Diagnosis not present

## 2022-01-22 DIAGNOSIS — C50912 Malignant neoplasm of unspecified site of left female breast: Secondary | ICD-10-CM

## 2022-01-22 DIAGNOSIS — E7800 Pure hypercholesterolemia, unspecified: Secondary | ICD-10-CM | POA: Diagnosis not present

## 2022-01-22 DIAGNOSIS — Z8042 Family history of malignant neoplasm of prostate: Secondary | ICD-10-CM | POA: Insufficient documentation

## 2022-01-22 DIAGNOSIS — Z79811 Long term (current) use of aromatase inhibitors: Secondary | ICD-10-CM | POA: Insufficient documentation

## 2022-01-22 DIAGNOSIS — Z803 Family history of malignant neoplasm of breast: Secondary | ICD-10-CM | POA: Diagnosis not present

## 2022-01-22 DIAGNOSIS — C50412 Malignant neoplasm of upper-outer quadrant of left female breast: Secondary | ICD-10-CM | POA: Insufficient documentation

## 2022-01-22 DIAGNOSIS — Z79899 Other long term (current) drug therapy: Secondary | ICD-10-CM | POA: Diagnosis not present

## 2022-01-22 DIAGNOSIS — Z87891 Personal history of nicotine dependence: Secondary | ICD-10-CM | POA: Diagnosis not present

## 2022-01-22 DIAGNOSIS — M8580 Other specified disorders of bone density and structure, unspecified site: Secondary | ICD-10-CM | POA: Diagnosis not present

## 2022-01-22 DIAGNOSIS — Z1379 Encounter for other screening for genetic and chromosomal anomalies: Secondary | ICD-10-CM | POA: Diagnosis not present

## 2022-01-22 DIAGNOSIS — Z170 Estrogen receptor positive status [ER+]: Secondary | ICD-10-CM | POA: Diagnosis not present

## 2022-01-22 LAB — CBC WITH DIFFERENTIAL/PLATELET
Abs Immature Granulocytes: 0.02 10*3/uL (ref 0.00–0.07)
Basophils Absolute: 0.1 10*3/uL (ref 0.0–0.1)
Basophils Relative: 1 %
Eosinophils Absolute: 0.7 10*3/uL — ABNORMAL HIGH (ref 0.0–0.5)
Eosinophils Relative: 12 %
HCT: 46.3 % — ABNORMAL HIGH (ref 36.0–46.0)
Hemoglobin: 16.5 g/dL — ABNORMAL HIGH (ref 12.0–15.0)
Immature Granulocytes: 0 %
Lymphocytes Relative: 12 %
Lymphs Abs: 0.7 10*3/uL (ref 0.7–4.0)
MCH: 30.1 pg (ref 26.0–34.0)
MCHC: 35.6 g/dL (ref 30.0–36.0)
MCV: 84.3 fL (ref 80.0–100.0)
Monocytes Absolute: 0.6 10*3/uL (ref 0.1–1.0)
Monocytes Relative: 10 %
Neutro Abs: 4.1 10*3/uL (ref 1.7–7.7)
Neutrophils Relative %: 65 %
Platelets: 247 10*3/uL (ref 150–400)
RBC: 5.49 MIL/uL — ABNORMAL HIGH (ref 3.87–5.11)
RDW: 12.8 % (ref 11.5–15.5)
WBC: 6.2 10*3/uL (ref 4.0–10.5)
nRBC: 0 % (ref 0.0–0.2)

## 2022-01-22 LAB — COMPREHENSIVE METABOLIC PANEL
ALT: 60 U/L — ABNORMAL HIGH (ref 0–44)
AST: 52 U/L — ABNORMAL HIGH (ref 15–41)
Albumin: 4.1 g/dL (ref 3.5–5.0)
Alkaline Phosphatase: 89 U/L (ref 38–126)
Anion gap: 12 (ref 5–15)
BUN: 11 mg/dL (ref 8–23)
CO2: 26 mmol/L (ref 22–32)
Calcium: 9.5 mg/dL (ref 8.9–10.3)
Chloride: 103 mmol/L (ref 98–111)
Creatinine, Ser: 0.85 mg/dL (ref 0.44–1.00)
GFR, Estimated: >60 mL/min (ref >60)
Glucose, Bld: 101 mg/dL — ABNORMAL HIGH (ref 70–99)
Potassium: 4 mmol/L (ref 3.5–5.1)
Sodium: 141 mmol/L (ref 135–145)
Total Bilirubin: 0.8 mg/dL (ref 0.3–1.2)
Total Protein: 7.1 g/dL (ref 6.5–8.1)

## 2022-01-22 LAB — IRON AND TIBC
Iron: 129 ug/dL (ref 28–170)
Saturation Ratios: 34 % — ABNORMAL HIGH (ref 10.4–31.8)
TIBC: 385 ug/dL (ref 250–450)
UIBC: 256 ug/dL

## 2022-01-22 LAB — FERRITIN: Ferritin: 81 ng/mL (ref 11–307)
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
## 2022-02-04 ENCOUNTER — Other Ambulatory Visit: Payer: Self-pay | Admitting: Family Medicine

## 2022-02-08 ENCOUNTER — Other Ambulatory Visit: Payer: Self-pay | Admitting: Family Medicine

## 2022-02-11 ENCOUNTER — Ambulatory Visit
Admission: RE | Admit: 2022-02-11 | Discharge: 2022-02-11 | Disposition: A | Discharge status: Home / Self Care | Payer: Medicare PPO | Source: Ambulatory Visit | Attending: Radiation Oncology | Admitting: Radiation Oncology

## 2022-02-11 ENCOUNTER — Encounter: Payer: Self-pay | Admitting: Radiation Oncology

## 2022-02-11 VITALS — BP 143/94 | HR 86 | Temp 98.0°F | Resp 16 | Ht 61.0 in | Wt 144.0 lb

## 2022-02-11 DIAGNOSIS — Z170 Estrogen receptor positive status [ER+]: Secondary | ICD-10-CM | POA: Insufficient documentation

## 2022-02-11 DIAGNOSIS — Z79811 Long term (current) use of aromatase inhibitors: Secondary | ICD-10-CM | POA: Insufficient documentation

## 2022-02-11 DIAGNOSIS — Z923 Personal history of irradiation: Secondary | ICD-10-CM | POA: Insufficient documentation

## 2022-02-11 DIAGNOSIS — C50912 Malignant neoplasm of unspecified site of left female breast: Secondary | ICD-10-CM | POA: Diagnosis not present

## 2022-02-11 NOTE — Telephone Encounter (Signed)
Refill request Cyclobenzaprine Last refill 12/09/21 #60/1 Last office visit 08/14/21

## 2022-02-11 NOTE — Progress Notes (Signed)
Radiation Oncology Follow up Note  Name: Katie Dennis   Date:   02/11/2022 MRN:  [id number] DOB: [birth year]    This 67 y.o. female presents to the clinic today for 1 month follow-up status post whole breast radiation to her left breast for stage Ia (T1b N0 M0) ER/PR positive invasive mammary carcinoma.  REFERRING PROVIDER: Jinny Sanders, MD  HPI: Patient is a 67 year old female now out 1 month having completed whole breast radiation to her left breast for stage Ia ER/PR positive invasive mammary carcinoma.  Seen today in routine follow-up she is doing well.  She specifically denies breast tenderness cough or bone pain..  She has been started on letrozole and is tolerating that well.  COMPLICATIONS OF TREATMENT: none  FOLLOW UP COMPLIANCE: keeps appointments   PHYSICAL EXAM:  BP (!) 143/94   Pulse 86   Temp 98 F (36.7 C) (Tympanic)   Resp 16   Ht '5\' 1"'$  (1.549 m)   Wt 144 lb (65.3 kg)   BMI 27.21 kg/m  Lungs are clear to A&P cardiac examination essentially unremarkable with regular rate and rhythm. No dominant mass or nodularity is noted in either breast in 2 positions examined. Incision is well-healed. No axillary or supraclavicular adenopathy is appreciated. Cosmetic result is excellent.  Well-developed well-nourished patient in NAD. HEENT reveals PERLA, EOMI, discs not visualized.  Oral cavity is clear. No oral mucosal lesions are identified. Neck is clear without evidence of cervical or supraclavicular adenopathy. Lungs are clear to A&P. Cardiac examination is essentially unremarkable with regular rate and rhythm without murmur rub or thrill. Abdomen is benign with no organomegaly or masses noted. Motor sensory and DTR levels are equal and symmetric in the upper and lower extremities. Cranial nerves II through XII are grossly intact. Proprioception is intact. No peripheral adenopathy or edema is identified. No motor or sensory levels are noted. Crude visual fields are within normal  range.  RADIOLOGY RESULTS: No current films for review  PLAN: Present time patient is doing well 1 month out from whole breast radiation and pleased with her overall progress.  I have asked to see her back in 6 months for follow-up.  She continues on letrozole without side effect.  Patient is to call with any concerns.  I would like to take this opportunity to thank you for allowing me to participate in the care of your patient.Noreene Filbert, MD

## 2022-03-25 ENCOUNTER — Other Ambulatory Visit: Payer: Self-pay | Admitting: Family Medicine

## 2022-04-09 ENCOUNTER — Other Ambulatory Visit: Payer: Self-pay | Admitting: Family Medicine

## 2022-04-09 NOTE — Telephone Encounter (Signed)
Last office visit 08/14/21 for GYN Exam.  Last refilled 02/11/2022 for #60 with 1 refill.  Next Appt: No future appointments with PCP.

## 2022-05-02 ENCOUNTER — Other Ambulatory Visit: Payer: Self-pay | Admitting: Family Medicine

## 2022-05-20 ENCOUNTER — Encounter: Payer: Self-pay | Admitting: Surgery

## 2022-05-20 ENCOUNTER — Ambulatory Visit: Payer: Medicare PPO | Admitting: Surgery

## 2022-05-20 VITALS — BP 106/68 | HR 92 | Temp 98.0°F | Ht 61.0 in | Wt 144.0 lb

## 2022-05-20 DIAGNOSIS — Z170 Estrogen receptor positive status [ER+]: Secondary | ICD-10-CM

## 2022-05-20 DIAGNOSIS — C50912 Malignant neoplasm of unspecified site of left female breast: Secondary | ICD-10-CM

## 2022-05-20 DIAGNOSIS — C50412 Malignant neoplasm of upper-outer quadrant of left female breast: Secondary | ICD-10-CM | POA: Diagnosis not present

## 2022-05-20 NOTE — Patient Instructions (Addendum)
If you have any concerns or questions, please feel free to call our office. Follow up in August 2024. Call [facility] to schedule 1 year Mammo around the 1st of August.   Breast Self-Awareness Breast self-awareness is knowing how your breasts look and feel. You need to: Check your breasts on a regular basis. Tell your doctor about any changes. Become familiar with the look and feel of your breasts. This can help you catch a breast problem while it is still small and can be treated. You should do breast self-exams even if you have breast implants. What you need: A mirror. A well-lit room. A pillow or other soft object. How to do a breast self-exam Follow these steps to do a breast self-exam: Look for changes  Take off all the clothes above your waist. Stand in front of a mirror in a room with good lighting. Put your hands down at your sides. Compare your breasts in the mirror. Look for any difference between them, such as: A difference in shape. A difference in size. Wrinkles, dips, and bumps in one breast and not the other. Look at each breast for changes in the skin, such as: Redness. Scaly areas. Skin that has gotten thicker. Dimpling. Open sores (ulcers). Look for changes in your nipples, such as: Fluid coming out of a nipple. Fluid around a nipple. Bleeding. Dimpling. Redness. A nipple that looks pushed in (retracted), or that has changed position. Feel for changes Lie on your back. Feel each breast. To do this: Pick a breast to feel. Place a pillow under the shoulder closest to that breast. Put the arm closest to that breast behind your head. Feel the nipple area of that breast using the hand of your other arm. Feel the area with the pads of your three middle fingers by making small circles with your fingers. Use light, medium, and firm pressure. Continue the overlapping circles, moving downward over the breast. Keep making circles with your fingers. Stop  when you feel your ribs. Start making circles with your fingers again, this time going upward until you reach your collarbone. Then, make circles outward across your breast and into your armpit area. Squeeze your nipple. Check for discharge and lumps. Repeat these steps to check your other breast. Sit or stand in the tub or shower. With soapy water on your skin, feel each breast the same way you did when you were lying down. Write down what you find Writing down what you find can help you remember what to tell your doctor. Write down: What is normal for each breast. Any changes you find in each breast. These include: The kind of changes you find. A tender or painful breast. Any lump you find. Write down its size and where it is. When you last had your monthly period (menstrual cycle). General tips If you are breastfeeding, the best time to check your breasts is after you feed your baby or after you use a breast pump. If you get monthly bleeding, the best time to check your breasts is 5-7 days after your monthly cycle ends. With time, you will become comfortable with the self-exam. You will also start to know if there are changes in your breasts. Contact a doctor if: You see a change in the shape or size of your breasts or nipples. You see a change in the skin of your breast or nipples, such as red or scaly skin. You have fluid coming from your nipples that is not  normal. You find a new lump or thick area. You have breast pain. You have any concerns about your breast health. Summary Breast self-awareness includes looking for changes in your breasts and feeling for changes within your breasts. You should do breast self-awareness in front of a mirror in a well-lit room. If you get monthly periods (menstrual cycles), the best time to check your breasts is 5-7 days after your period ends. Tell your doctor about any changes you see in your breasts. Changes include changes in size, changes on  the skin, painful or tender breasts, or fluid from your nipples that is not normal. This information is not intended to replace advice given to you by your health care provider. Make sure you discuss any questions you have with your health care provider. Document Revised: 07/03/2021 Document Reviewed: 11/28/2020 Elsevier Patient Education  2023 [employer].
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
## 2022-05-25 ENCOUNTER — Other Ambulatory Visit: Payer: Self-pay | Admitting: *Deleted

## 2022-05-25 ENCOUNTER — Encounter: Payer: Self-pay | Admitting: Internal Medicine

## 2022-05-25 ENCOUNTER — Inpatient Hospital Stay: Payer: Medicare PPO | Attending: Internal Medicine | Admitting: Internal Medicine

## 2022-05-25 VITALS — BP 127/76 | HR 87 | Temp 97.9°F | Resp 18 | Wt 141.8 lb

## 2022-05-25 DIAGNOSIS — C50912 Malignant neoplasm of unspecified site of left female breast: Secondary | ICD-10-CM

## 2022-05-25 DIAGNOSIS — Z87891 Personal history of nicotine dependence: Secondary | ICD-10-CM | POA: Insufficient documentation

## 2022-05-25 DIAGNOSIS — Z8042 Family history of malignant neoplasm of prostate: Secondary | ICD-10-CM | POA: Insufficient documentation

## 2022-05-25 DIAGNOSIS — Z79811 Long term (current) use of aromatase inhibitors: Secondary | ICD-10-CM

## 2022-05-25 DIAGNOSIS — C50412 Malignant neoplasm of upper-outer quadrant of left female breast: Secondary | ICD-10-CM | POA: Diagnosis not present

## 2022-05-25 DIAGNOSIS — Z5181 Encounter for therapeutic drug level monitoring: Secondary | ICD-10-CM | POA: Diagnosis not present

## 2022-05-25 DIAGNOSIS — Z170 Estrogen receptor positive status [ER+]: Secondary | ICD-10-CM | POA: Insufficient documentation

## 2022-05-25 DIAGNOSIS — Z803 Family history of malignant neoplasm of breast: Secondary | ICD-10-CM | POA: Insufficient documentation

## 2022-05-25 DIAGNOSIS — M8580 Other specified disorders of bone density and structure, unspecified site: Secondary | ICD-10-CM | POA: Diagnosis not present

## 2022-05-25 DIAGNOSIS — Z79899 Other long term (current) drug therapy: Secondary | ICD-10-CM | POA: Insufficient documentation

## 2022-05-25 NOTE — Progress Notes (Signed)
Patient here for follow up regarding breast cancer. Patient denies concerns.
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
## 2022-06-22 ENCOUNTER — Other Ambulatory Visit: Payer: Self-pay | Admitting: Family Medicine

## 2022-06-22 NOTE — Telephone Encounter (Signed)
Last office visit 08/14/21 for GYN Exam.  Last refilled 04/09/22 for #60 with 1 refill.  Next Appt: CPE 07/23/2022.

## 2022-06-30 ENCOUNTER — Telehealth: Payer: Self-pay | Admitting: *Deleted

## 2022-06-30 DIAGNOSIS — R7303 Prediabetes: Secondary | ICD-10-CM

## 2022-06-30 DIAGNOSIS — E7800 Pure hypercholesterolemia, unspecified: Secondary | ICD-10-CM

## 2022-06-30 NOTE — Telephone Encounter (Signed)
-----   Message from Alvina Chou sent at 06/29/2022  3:39 PM EDT ----- Regarding: Lab orders for Thursday, 6.6.24 Patient is scheduled for CPX labs, please order future labs, Thanks , Camelia Eng

## 2022-07-16 ENCOUNTER — Other Ambulatory Visit (INDEPENDENT_AMBULATORY_CARE_PROVIDER_SITE_OTHER): Payer: Medicare PPO

## 2022-07-16 DIAGNOSIS — R7303 Prediabetes: Secondary | ICD-10-CM | POA: Diagnosis not present

## 2022-07-16 DIAGNOSIS — D509 Iron deficiency anemia, unspecified: Secondary | ICD-10-CM | POA: Diagnosis not present

## 2022-07-16 DIAGNOSIS — E7800 Pure hypercholesterolemia, unspecified: Secondary | ICD-10-CM

## 2022-07-16 LAB — CBC WITH DIFFERENTIAL/PLATELET
Basophils Absolute: 0.1 K/uL (ref 0.0–0.1)
Basophils Relative: 1.1 % (ref 0.0–3.0)
Eosinophils Absolute: 0.3 K/uL (ref 0.0–0.7)
Eosinophils Relative: 5.2 % — ABNORMAL HIGH (ref 0.0–5.0)
HCT: 48.6 % — ABNORMAL HIGH (ref 36.0–46.0)
Hemoglobin: 16.3 g/dL — ABNORMAL HIGH (ref 12.0–15.0)
Lymphocytes Relative: 16.8 % (ref 12.0–46.0)
Lymphs Abs: 1 K/uL (ref 0.7–4.0)
MCHC: 33.6 g/dL (ref 30.0–36.0)
MCV: 86.6 fl (ref 78.0–100.0)
Monocytes Absolute: 0.5 K/uL (ref 0.1–1.0)
Monocytes Relative: 8.4 % (ref 3.0–12.0)
Neutro Abs: 3.9 K/uL (ref 1.4–7.7)
Neutrophils Relative %: 68.5 % (ref 43.0–77.0)
Platelets: 230 K/uL (ref 150.0–400.0)
RBC: 5.61 Mil/uL — ABNORMAL HIGH (ref 3.87–5.11)
RDW: 13.5 % (ref 11.5–15.5)
WBC: 5.7 K/uL (ref 4.0–10.5)

## 2022-07-16 LAB — COMPREHENSIVE METABOLIC PANEL
ALT: 57 U/L — ABNORMAL HIGH (ref 0–35)
AST: 44 U/L — ABNORMAL HIGH (ref 0–37)
Albumin: 4.2 g/dL (ref 3.5–5.2)
Alkaline Phosphatase: 85 U/L (ref 39–117)
BUN: 10 mg/dL (ref 6–23)
CO2: 31 mEq/L (ref 19–32)
Calcium: 9.5 mg/dL (ref 8.4–10.5)
Chloride: 100 mEq/L (ref 96–112)
Creatinine, Ser: 0.76 mg/dL (ref 0.40–1.20)
GFR: 81.56 mL/min (ref >60.00)
Glucose, Bld: 95 mg/dL (ref 70–99)
Potassium: 3.8 mEq/L (ref 3.5–5.1)
Sodium: 140 mEq/L (ref 135–145)
Total Bilirubin: 0.7 mg/dL (ref 0.2–1.2)
Total Protein: 6.8 g/dL (ref 6.0–8.3)

## 2022-07-16 LAB — LIPID PANEL
Cholesterol: 259 mg/dL — ABNORMAL HIGH (ref 0–200)
HDL: 62.3 mg/dL (ref >39.00)
LDL Cholesterol: 161 mg/dL — ABNORMAL HIGH (ref 0–99)
NonHDL: 196.66
Total CHOL/HDL Ratio: 4
Triglycerides: 180 mg/dL — ABNORMAL HIGH (ref 0.0–149.0)
VLDL: 36 mg/dL (ref 0.0–40.0)

## 2022-07-16 LAB — HEMOGLOBIN A1C: Hgb A1c MFr Bld: 5.8 % (ref 4.6–6.5)

## 2022-07-16 NOTE — Progress Notes (Signed)
No critical labs need to be addressed urgently. We will discuss labs in detail at upcoming office visit.   

## 2022-07-23 ENCOUNTER — Encounter: Payer: Self-pay | Admitting: Family Medicine

## 2022-07-23 ENCOUNTER — Ambulatory Visit (INDEPENDENT_AMBULATORY_CARE_PROVIDER_SITE_OTHER): Payer: Medicare PPO | Admitting: Family Medicine

## 2022-07-23 VITALS — BP 122/82 | HR 75 | Temp 97.8°F | Ht 60.5 in | Wt 145.0 lb

## 2022-07-23 DIAGNOSIS — R7989 Other specified abnormal findings of blood chemistry: Secondary | ICD-10-CM | POA: Diagnosis not present

## 2022-07-23 DIAGNOSIS — C50912 Malignant neoplasm of unspecified site of left female breast: Secondary | ICD-10-CM | POA: Diagnosis not present

## 2022-07-23 DIAGNOSIS — Z0000 Encounter for general adult medical examination without abnormal findings: Secondary | ICD-10-CM

## 2022-07-23 DIAGNOSIS — I10 Essential (primary) hypertension: Secondary | ICD-10-CM

## 2022-07-23 DIAGNOSIS — F3341 Major depressive disorder, recurrent, in partial remission: Secondary | ICD-10-CM | POA: Diagnosis not present

## 2022-07-23 DIAGNOSIS — E7800 Pure hypercholesterolemia, unspecified: Secondary | ICD-10-CM | POA: Diagnosis not present

## 2022-07-23 NOTE — Assessment & Plan Note (Signed)
Detected on screening mammogram.  Status post left breast biopsy on 09/25/2021.   Followed by oncology Dr. Michae Kava  - s/p left breast lumpectomy and SLNB on 10/28/2021. 8 mm IDC, 0/4 lymph nodes involved, margins negative, overall grade 2. Rare foci DCIS, margin from DCIS 12 mm    - Oncotype Dx RS 4. No chemotherapy.    - Radiation to Rt breast with scar boost 12/03/21 to 12/31/21   - Started Letrozole 2.5 mg daily on 01/01/2022.

## 2022-07-23 NOTE — Assessment & Plan Note (Signed)
Chronic, intermittent Given worsening of diet this is most likely due to fatty liver.  She does have a history remotely of excessive alcohol use/alcohol abuse.  She denies current alcohol use.  No family history of liver problems.  Will reevaluate in 3 months along with hepatitis panel.  Consider ultrasound if changes worsen.

## 2022-07-23 NOTE — Assessment & Plan Note (Signed)
Good control on HCTZ 25 mg daily and losartan 100 mg daily  

## 2022-07-23 NOTE — Assessment & Plan Note (Signed)
Stable, chronic.  Continue current medication.  Cymbalta 60 mg daily 

## 2022-07-23 NOTE — Progress Notes (Signed)
Patient ID: Katie Dennis, female    DOB: 31-Jan-1956, 67 y.o.   MRN: [id number]  This visit was conducted in person.  BP 122/82   Pulse 75   Temp 97.8 F (36.6 C) (Temporal)   Ht 5' 0.5" (1.537 m)   Wt 145 lb (65.8 kg)   SpO2 94%   BMI 27.85 kg/m    CC:  Chief Complaint  Patient presents with   Medicare Wellness    Subjective:   HPI: Katie Dennis is a 67 y.o. female presenting on 07/23/2022 for  [facility]  The patient presents for  medicare wellness, complete physical and review of chronic health problems. He/She also has the following acute concerns today:  Breast cancer Dx in last year.   I have personally reviewed the Medicare Annual Wellness questionnaire and have noted 1. The patient's medical and social history 2. Their use of alcohol, tobacco or illicit drugs 3. Their current medications and supplements 4. The patient's functional ability including ADL's, fall risks, home safety risks and hearing or visual             impairment. 5. Diet and physical activities 6. Evidence for depression or mood disorders 7.         Updated provider list Cognitive evaluation was performed and recorded on pt medicare questionnaire form. The patients weight, height, BMI and visual acuity have been recorded in the chart   I have made referrals, counseling and provided education to the patient based review of the above and I have provided the pt with a written personalized care plan for preventive services.   Documentation of this information was scanned into the electronic record under the media tab.   Advance directives and end of life planning reviewed in detail with patient and documented in EMR. Patient given handout on advance care directives if needed. HCPOA and living will updated if needed.  No falls in last 12 months.  Hearing Screening   500Hz  1000Hz  2000Hz  4000Hz   Right ear 25 20 25  40  Left ear 20 25 25  40   Vision Screening   Right eye Left eye  Both eyes  Without correction     With correction 20/40 20/40 20/30      Elevated Cholesterol:  Significant worsening cholesterol on last year on no medication.             She has not been focusing on low-cholesterol diet.She has been caregiving. Lab Results  Component Value Date   CHOL 259 (H) 07/16/2022   HDL 62.30 07/16/2022   LDLCALC 161 (H) 07/16/2022   LDLDIRECT 153.4 11/15/2012   TRIG 180.0 (H) 07/16/2022   CHOLHDL 4 07/16/2022  The 10-year ASCVD risk score (Arnett DK, et al., 2019) is: 8.1%   Values used to calculate the score:     Age: 2 years     Sex: Female     Is Non-Hispanic African American: No     Diabetic: No     Tobacco smoker: No     Systolic Blood Pressure: 122 mmHg     Is BP treated: Yes     HDL Cholesterol: 62.3 mg/dL     Total Cholesterol: 259 mg/dL Using medications without problems: Muscle aches:  Diet compliance: moderate Exercise: will re-start Other complaints:  Hypertension:   Well controlled on losartan 100 mg daily, HCTZ 25 mg daily  BP Readings from Last 3 Encounters:  07/23/22 122/82  05/25/22 127/76  05/20/22 106/68  Using medication without problems or lightheadedness:  none Chest pain with exertion: none Edema:  occ at end of the day Short of breath: yes Average home BPs: Other issues:    Prediabetes   improving Lab Results  Component Value Date   HGBA1C 5.8 07/16/2022      MDD :  Stable control on cymbalta  60 mg daily Flowsheet Row Office Visit from 07/17/2021 in [facility] [place]  PHQ-2 Total Score 1        Relevant past medical, surgical, family and social history reviewed and updated as indicated. Interim medical history since our last visit reviewed. Allergies and medications reviewed and updated. Outpatient Medications Prior to Visit  Medication Sig Dispense Refill   calcium carbonate (OS-CAL - DOSED IN MG OF ELEMENTAL CALCIUM) 1250 (500 Ca) MG tablet Take 1 tablet by mouth 2 (two)  times daily with a meal.     cyclobenzaprine (FLEXERIL) 10 MG tablet TAKE 1 TO 2 TABLETS BY MOUTH DAILY AT BEDTIME 60 tablet 1   DULoxetine (CYMBALTA) 60 MG capsule Take 1 capsule (60 mg total) by mouth at bedtime. 90 capsule 1   ferrous sulfate 325 (65 FE) MG EC tablet Take 325 mg by mouth in the morning and at bedtime.     hydrochlorothiazide (HYDRODIURIL) 25 MG tablet TAKE 1 TABLET BY MOUTH EVERY DAY 90 tablet 1   letrozole (FEMARA) 2.5 MG tablet TAKE 1 TABLET BY MOUTH EVERY DAY 90 tablet 3   losartan (COZAAR) 100 MG tablet TAKE 1 TABLET BY MOUTH EVERY DAY (Patient taking differently: Take 100 mg by mouth daily.) 90 tablet 3   Multiple Vitamins-Minerals (MULTIVITAMIN WOMEN PO) Take by mouth daily.     senna (SENOKOT) 8.6 MG tablet Take 2 tablets by mouth daily. As needed     traMADol (ULTRAM) 50 MG tablet Take 1 tablet (50 mg total) by mouth 2 (two) times daily as needed. 60 tablet 0   Cholecalciferol (VITAMIN D3) 25 MCG (1000 UT) CAPS Take 1,000 Int'l Units by mouth daily.     No facility-administered medications prior to visit.     Per HPI unless specifically indicated in ROS section below Review of Systems Objective:  BP 122/82   Pulse 75   Temp 97.8 F (36.6 C) (Temporal)   Ht 5' 0.5" (1.537 m)   Wt 145 lb (65.8 kg)   SpO2 94%   BMI 27.85 kg/m   Wt Readings from Last 3 Encounters:  07/23/22 145 lb (65.8 kg)  05/25/22 141 lb 12.8 oz (64.3 kg)  05/20/22 144 lb (65.3 kg)      Physical Exam    Results for orders placed or performed in visit on 07/16/22  Lipid panel  Result Value Ref Range   Cholesterol 259 (H) 0 - 200 mg/dL   Triglycerides 161.0 (H) 0.0 - 149.0 mg/dL   HDL 96.04 >54.09 mg/dL   VLDL 81.1 0.0 - 91.4 mg/dL   LDL Cholesterol 782 (H) 0 - 99 mg/dL   Total CHOL/HDL Ratio 4    NonHDL 196.66   Hemoglobin A1c  Result Value Ref Range   Hgb A1c MFr Bld 5.8 4.6 - 6.5 %  Comprehensive metabolic panel  Result Value Ref Range   Sodium 140 135 - 145 mEq/L    Potassium 3.8 3.5 - 5.1 mEq/L   Chloride 100 96 - 112 mEq/L   CO2 31 19 - 32 mEq/L   Glucose, Bld 95 70 - 99 mg/dL  BUN 10 6 - 23 mg/dL   Creatinine, Ser 1.61 0.40 - 1.20 mg/dL   Total Bilirubin 0.7 0.2 - 1.2 mg/dL   Alkaline Phosphatase 85 39 - 117 U/L   AST 44 (H) 0 - 37 U/L   ALT 57 (H) 0 - 35 U/L   Total Protein 6.8 6.0 - 8.3 g/dL   Albumin 4.2 3.5 - 5.2 g/dL   GFR 09.60 >45.40 mL/min   Calcium 9.5 8.4 - 10.5 mg/dL  CBC with Differential  Result Value Ref Range   WBC 5.7 4.0 - 10.5 K/uL   RBC 5.61 (H) 3.87 - 5.11 Mil/uL   Hemoglobin 16.3 (H) 12.0 - 15.0 g/dL   HCT 98.1 (H) 19.1 - 47.8 %   MCV 86.6 78.0 - 100.0 fl   MCHC 33.6 30.0 - 36.0 g/dL   RDW 29.5 62.1 - 30.8 %   Platelets 230.0 150.0 - 400.0 K/uL   Neutrophils Relative % 68.5 43.0 - 77.0 %   Lymphocytes Relative 16.8 12.0 - 46.0 %   Monocytes Relative 8.4 3.0 - 12.0 %   Eosinophils Relative 5.2 (H) 0.0 - 5.0 %   Basophils Relative 1.1 0.0 - 3.0 %   Neutro Abs 3.9 1.4 - 7.7 K/uL   Lymphs Abs 1.0 0.7 - 4.0 K/uL   Monocytes Absolute 0.5 0.1 - 1.0 K/uL   Eosinophils Absolute 0.3 0.0 - 0.7 K/uL   Basophils Absolute 0.1 0.0 - 0.1 K/uL     COVID 19 screen:  No recent travel or known exposure to COVID19 The patient denies respiratory symptoms of COVID 19 at this time. The importance of social distancing was discussed today.   Assessment and Plan The patient's preventative maintenance and recommended screening tests for an annual wellness exam were reviewed in full today. Brought up to date unless services declined.  Counselled on the importance of diet, exercise, and its role in overall health and mortality. The patient's FH and SH was reviewed, including their home life, tobacco status, and drug and alcohol status.    2013 EKG baseline  Last pap 2018 nml, on q5 year schedule.  Plan last at age 47... due today.. she would like to  postpone given not feeling well.  No family history of ovarian  uterine  cancer. Colon cancer screening:   ifob neg 05/2019,   cologuard 2023 negative.  mammogram  left breast cancer, repeat in 08/2022 Vaccines: uptodate with Tdap, flu, COVID x 4, PNA 20, shingrix DEXA: 2023 osteopenia repeat  in 2 years Former smoker 25-35 pack year history, QUIT remotely 1996  Hep C: done  HIV: refused   Medicare annual wellness visit, subsequent  Recurrent major depressive disorder, in partial remission (HCC) Assessment & Plan: Stable, chronic.  Continue current medication.    Cymbalta 60 mg daily   Primary hypertension Assessment & Plan: Good control on HCTZ 25 mg daily and losartan 100 mg daily    Pure hypercholesterolemia Assessment & Plan: T Worsened control now with 8.2 %10 year risk of CVD.   Not working on lifestyle change.. will return to heart healthy diet and lifestyle.Marland Kitchen re-eval in 3 months with labs.  Orders: -     Comprehensive metabolic panel; Future  Invasive ductal carcinoma of left breast [facility]) Assessment & Plan: Detected on screening mammogram.  Status post left breast biopsy on 09/25/2021.   Followed by oncology Dr. Michae Kava  - s/p left breast lumpectomy and SLNB on 10/28/2021. 8 mm IDC, 0/4 lymph nodes involved, margins negative,  overall grade 2. Rare foci DCIS, margin from DCIS 12 mm    - Oncotype Dx RS 4. No chemotherapy.    - Radiation to Rt breast with scar boost 12/03/21 to 12/31/21   - Started Letrozole 2.5 mg daily on 01/01/2022.    Elevated LFTs Assessment & Plan: Chronic, intermittent Given worsening of diet this is most likely due to fatty liver.  She does have a history remotely of excessive alcohol use/alcohol abuse.  She denies current alcohol use.  No family history of liver problems.  Will reevaluate in 3 months along with hepatitis panel.  Consider ultrasound if changes worsen.  Orders: -     Lipid panel; Future    Kerby Nora, MD

## 2022-07-23 NOTE — Patient Instructions (Signed)
Work on low cholesterol diet, regular exercise and weight loss.  Return in 3 months for cholesterol recheck.

## 2022-07-23 NOTE — Assessment & Plan Note (Addendum)
T Worsened control now with 8.2 %10 year risk of CVD.   Not working on lifestyle change.. will return to heart healthy diet and lifestyle.Marland Kitchen re-eval in 3 months with labs.

## 2022-07-29 ENCOUNTER — Other Ambulatory Visit: Payer: Self-pay | Admitting: Family Medicine

## 2022-08-04 ENCOUNTER — Other Ambulatory Visit: Payer: Self-pay

## 2022-08-12 ENCOUNTER — Ambulatory Visit: Payer: Medicare PPO | Admitting: Radiation Oncology

## 2022-08-18 ENCOUNTER — Other Ambulatory Visit: Payer: Self-pay | Admitting: Family Medicine

## 2022-08-18 NOTE — Telephone Encounter (Signed)
Last office visit 07/23/2022 for MWV.  Last refilled 06/23/2022 for #60 with 1 refill.  Next Appt: No future appointment with PCP.

## 2022-09-07 ENCOUNTER — Ambulatory Visit
Admission: RE | Admit: 2022-09-07 | Discharge: 2022-09-07 | Disposition: A | Discharge status: Home / Self Care | Payer: Medicare PPO | Source: Ambulatory Visit | Attending: Internal Medicine | Admitting: Internal Medicine

## 2022-09-07 DIAGNOSIS — Z853 Personal history of malignant neoplasm of breast: Secondary | ICD-10-CM | POA: Diagnosis not present

## 2022-09-07 DIAGNOSIS — Z1239 Encounter for other screening for malignant neoplasm of breast: Secondary | ICD-10-CM | POA: Insufficient documentation

## 2022-09-07 DIAGNOSIS — C50912 Malignant neoplasm of unspecified site of left female breast: Secondary | ICD-10-CM | POA: Diagnosis not present

## 2022-09-07 DIAGNOSIS — R92323 Mammographic fibroglandular density, bilateral breasts: Secondary | ICD-10-CM | POA: Insufficient documentation

## 2022-09-10 ENCOUNTER — Ambulatory Visit
Admission: RE | Admit: 2022-09-10 | Discharge: 2022-09-10 | Disposition: A | Discharge status: Home / Self Care | Payer: Medicare PPO | Source: Ambulatory Visit | Attending: Radiation Oncology | Admitting: Radiation Oncology

## 2022-09-10 ENCOUNTER — Encounter: Payer: Self-pay | Admitting: Radiation Oncology

## 2022-09-10 VITALS — BP 114/76 | HR 83 | Temp 97.4°F | Resp 16 | Wt 146.0 lb

## 2022-09-10 DIAGNOSIS — C50412 Malignant neoplasm of upper-outer quadrant of left female breast: Secondary | ICD-10-CM | POA: Diagnosis not present

## 2022-09-10 DIAGNOSIS — Z79811 Long term (current) use of aromatase inhibitors: Secondary | ICD-10-CM | POA: Diagnosis not present

## 2022-09-10 DIAGNOSIS — Z923 Personal history of irradiation: Secondary | ICD-10-CM | POA: Insufficient documentation

## 2022-09-10 DIAGNOSIS — C50912 Malignant neoplasm of unspecified site of left female breast: Secondary | ICD-10-CM | POA: Diagnosis not present

## 2022-09-10 DIAGNOSIS — Z170 Estrogen receptor positive status [ER+]: Secondary | ICD-10-CM | POA: Insufficient documentation

## 2022-09-10 NOTE — Progress Notes (Signed)
Radiation Oncology Follow up Note  Name: Katie Dennis   Date:   09/10/2022 MRN:  [id number] DOB: 03/02/1955    This 67 y.o. female presents to the clinic today for 10-month follow-up status post whole breast radiation to her left breast for stage Ia (T1b N0 M0) ER/PR positive invasive mammary carcinoma.  REFERRING PROVIDER: Excell Seltzer, MD  HPI: Patient is a 67 year old female now out 7 months having completed whole breast radiation to her left breast for stage Ia ER positive invasive mammary carcinoma.  Seen today in routine follow-up she is doing well.  She specifically denies breast tenderness cough or bone pain..  She had a mammogram last week which I have reviewed was BI-RADS 2 benign.  She is currently on Femara tolerating it well without side effect.  COMPLICATIONS OF TREATMENT: none  FOLLOW UP COMPLIANCE: keeps appointments   PHYSICAL EXAM:  BP 114/76   Pulse 83   Temp (!) 97.4 F (36.3 C) (Tympanic)   Resp 16   Wt 146 lb (66.2 kg)   BMI 28.04 kg/m  Lungs are clear to A&P cardiac examination essentially unremarkable with regular rate and rhythm. No dominant mass or nodularity is noted in either breast in 2 positions examined. Incision is well-healed. No axillary or supraclavicular adenopathy is appreciated. Cosmetic result is excellent.  Well-developed well-nourished patient in NAD. HEENT reveals PERLA, EOMI, discs not visualized.  Oral cavity is clear. No oral mucosal lesions are identified. Neck is clear without evidence of cervical or supraclavicular adenopathy. Lungs are clear to A&P. Cardiac examination is essentially unremarkable with regular rate and rhythm without murmur rub or thrill. Abdomen is benign with no organomegaly or masses noted. Motor sensory and DTR levels are equal and symmetric in the upper and lower extremities. Cranial nerves II through XII are grossly intact. Proprioception is intact. No peripheral adenopathy or edema is identified. No motor or sensory  levels are noted. Crude visual fields are within normal range.  RADIOLOGY RESULTS: Mammograms reviewed compatible with above-stated findings  PLAN: Present time patient is doing well 7 months out with no evidence of disease.  She continues on endocrine therapy.  I have asked to see her back in 6 months for follow-up.  Patient is to call with any concerns.  I would like to take this opportunity to thank you for allowing me to participate in the care of your patient.Katie Miller, MD

## 2022-09-14 ENCOUNTER — Encounter: Payer: Self-pay | Admitting: Surgery

## 2022-09-14 ENCOUNTER — Ambulatory Visit: Payer: Medicare PPO | Admitting: Surgery

## 2022-09-14 VITALS — BP 132/86 | HR 77 | Temp 98.8°F | Ht 61.0 in | Wt 147.8 lb

## 2022-09-14 DIAGNOSIS — C50412 Malignant neoplasm of upper-outer quadrant of left female breast: Secondary | ICD-10-CM

## 2022-09-14 DIAGNOSIS — Z170 Estrogen receptor positive status [ER+]: Secondary | ICD-10-CM | POA: Diagnosis not present

## 2022-09-14 DIAGNOSIS — C50912 Malignant neoplasm of unspecified site of left female breast: Secondary | ICD-10-CM

## 2022-09-14 NOTE — Patient Instructions (Signed)
If you have any concerns or questions, please feel free to call our office. Follow up in 1 year.   Breast Self-Awareness Breast self-awareness is knowing how your breasts look and feel. You need to: Check your breasts on a regular basis. Tell your doctor about any changes. Become familiar with the look and feel of your breasts. This can help you catch a breast problem while it is still small and can be treated. You should do breast self-exams even if you have breast implants. What you need: A mirror. A well-lit room. A pillow or other soft object. How to do a breast self-exam Follow these steps to do a breast self-exam: Look for changes  Take off all the clothes above your waist. Stand in front of a mirror in a room with good lighting. Put your hands down at your sides. Compare your breasts in the mirror. Look for any difference between them, such as: A difference in shape. A difference in size. Wrinkles, dips, and bumps in one breast and not the other. Look at each breast for changes in the skin, such as: Redness. Scaly areas. Skin that has gotten thicker. Dimpling. Open sores (ulcers). Look for changes in your nipples, such as: Fluid coming out of a nipple. Fluid around a nipple. Bleeding. Dimpling. Redness. A nipple that looks pushed in (retracted), or that has changed position. Feel for changes Lie on your back. Feel each breast. To do this: Pick a breast to feel. Place a pillow under the shoulder closest to that breast. Put the arm closest to that breast behind your head. Feel the nipple area of that breast using the hand of your other arm. Feel the area with the pads of your three middle fingers by making small circles with your fingers. Use light, medium, and firm pressure. Continue the overlapping circles, moving downward over the breast. Keep making circles with your fingers. Stop when you feel your ribs. Start making circles with your fingers again, this time going  upward until you reach your collarbone. Then, make circles outward across your breast and into your armpit area. Squeeze your nipple. Check for discharge and lumps. Repeat these steps to check your other breast. Sit or stand in the tub or shower. With soapy water on your skin, feel each breast the same way you did when you were lying down. Write down what you find Writing down what you find can help you remember what to tell your doctor. Write down: What is normal for each breast. Any changes you find in each breast. These include: The kind of changes you find. A tender or painful breast. Any lump you find. Write down its size and where it is. When you last had your monthly period (menstrual cycle). General tips If you are breastfeeding, the best time to check your breasts is after you feed your baby or after you use a breast pump. If you get monthly bleeding, the best time to check your breasts is 5-7 days after your monthly cycle ends. With time, you will become comfortable with the self-exam. You will also start to know if there are changes in your breasts. Contact a doctor if: You see a change in the shape or size of your breasts or nipples. You see a change in the skin of your breast or nipples, such as red or scaly skin. You have fluid coming from your nipples that is not normal. You find a new lump or thick area. You have breast pain. You  have any concerns about your breast health. Summary Breast self-awareness includes looking for changes in your breasts and feeling for changes within your breasts. You should do breast self-awareness in front of a mirror in a well-lit room. If you get monthly periods (menstrual cycles), the best time to check your breasts is 5-7 days after your period ends. Tell your doctor about any changes you see in your breasts. Changes include changes in size, changes on the skin, painful or tender breasts, or fluid from your nipples that is not  normal. This information is not intended to replace advice given to you by your health care provider. Make sure you discuss any questions you have with your health care provider. Document Revised: 07/03/2021 Document Reviewed: 11/28/2020 Elsevier Patient Education  2024 [employer].

## 2022-09-16 ENCOUNTER — Encounter: Payer: Self-pay | Admitting: Surgery

## 2022-09-16 ENCOUNTER — Other Ambulatory Visit: Payer: Self-pay | Admitting: Family Medicine
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
## 2022-09-18 ENCOUNTER — Encounter: Payer: Self-pay | Admitting: Family Medicine

## 2022-09-19 ENCOUNTER — Other Ambulatory Visit: Payer: Self-pay | Admitting: Family Medicine

## 2022-09-21 ENCOUNTER — Telehealth: Payer: Self-pay

## 2022-09-21 NOTE — Patient Outreach (Signed)
  Care Coordination   Initial Visit Note   09/21/2022 Name: Katie Dennis MRN: [id number] DOB: 12-05-1955  Katie Dennis is a 67 y.o. year old female who sees Excell Seltzer, MD for primary care. I spoke with  [name] by phone today.  What matters to the patients health and wellness today?  Patient verbalized appreciation of call. Patient denies having any nursing and/ or community resource needs at this time.      Goals Addressed             This Visit's Progress    COMPLETED: care coordination activities - no follow up needed       Interventions Today    Flowsheet Row Most Recent Value  General Interventions   General Interventions Discussed/Reviewed General Interventions Discussed, Vaccines  [Care coordination services discussed. SDOH survey completed. AWV discussed and patient advised to contact provider office to schedule. Discussed vaccines. Advised to contact PCP office if care coordination services needed in the future.]              SDOH assessments and interventions completed:  Yes  SDOH Interventions Today    Flowsheet Row Most Recent Value  SDOH Interventions   Food Insecurity Interventions Intervention Not Indicated  Housing Interventions Intervention Not Indicated  Transportation Interventions Intervention Not Indicated        Care Coordination Interventions:  Yes, provided   Follow up plan: No further intervention required.   Encounter Outcome:  Pt. Visit Completed   George Ina RN,BSN,CCM [facility] Care Coordination [phone] direct line

## 2022-09-21 NOTE — Telephone Encounter (Signed)
Last office visit 07/23/2022 for MWV.  Last refilled 01/06/22 for #60 with no refills.  Next Appt: No future appointments with PCP.

## 2022-11-16 ENCOUNTER — Ambulatory Visit: Payer: Medicare PPO

## 2022-11-16 DIAGNOSIS — Z719 Counseling, unspecified: Secondary | ICD-10-CM

## 2022-11-16 DIAGNOSIS — Z23 Encounter for immunization: Secondary | ICD-10-CM

## 2022-11-16 NOTE — Progress Notes (Signed)
Patient seen in nurse clinic for Flu and COVID vaccines.  Comirnaty 2024-25 +12Y and Flu HD IM in right deltoid.  Patient wanted both vaccines in right deltoid. Tolerated well. VIS declined. [facility] updated and copy provided.

## 2022-11-24 ENCOUNTER — Encounter: Payer: Self-pay | Admitting: Nurse Practitioner

## 2022-11-24 ENCOUNTER — Inpatient Hospital Stay (HOSPITAL_BASED_OUTPATIENT_CLINIC_OR_DEPARTMENT_OTHER): Payer: Medicare PPO | Admitting: Nurse Practitioner

## 2022-11-24 ENCOUNTER — Inpatient Hospital Stay: Payer: Medicare PPO | Attending: Internal Medicine

## 2022-11-24 VITALS — BP 137/80 | HR 91 | Temp 97.6°F | Ht 61.0 in | Wt 154.8 lb

## 2022-11-24 DIAGNOSIS — Z79811 Long term (current) use of aromatase inhibitors: Secondary | ICD-10-CM | POA: Diagnosis not present

## 2022-11-24 DIAGNOSIS — Z87891 Personal history of nicotine dependence: Secondary | ICD-10-CM | POA: Insufficient documentation

## 2022-11-24 DIAGNOSIS — Z5181 Encounter for therapeutic drug level monitoring: Secondary | ICD-10-CM

## 2022-11-24 DIAGNOSIS — M8580 Other specified disorders of bone density and structure, unspecified site: Secondary | ICD-10-CM

## 2022-11-24 DIAGNOSIS — Z79899 Other long term (current) drug therapy: Secondary | ICD-10-CM | POA: Insufficient documentation

## 2022-11-24 DIAGNOSIS — Z170 Estrogen receptor positive status [ER+]: Secondary | ICD-10-CM | POA: Diagnosis not present

## 2022-11-24 DIAGNOSIS — C50912 Malignant neoplasm of unspecified site of left female breast: Secondary | ICD-10-CM

## 2022-11-24 DIAGNOSIS — E785 Hyperlipidemia, unspecified: Secondary | ICD-10-CM | POA: Insufficient documentation

## 2022-11-24 DIAGNOSIS — Z803 Family history of malignant neoplasm of breast: Secondary | ICD-10-CM | POA: Diagnosis not present

## 2022-11-24 DIAGNOSIS — Z08 Encounter for follow-up examination after completed treatment for malignant neoplasm: Secondary | ICD-10-CM

## 2022-11-24 LAB — CBC WITH DIFFERENTIAL/PLATELET
Abs Immature Granulocytes: 0.07 10*3/uL (ref 0.00–0.07)
Basophils Absolute: 0.1 10*3/uL (ref 0.0–0.1)
Basophils Relative: 1 %
Eosinophils Absolute: 0.4 10*3/uL (ref 0.0–0.5)
Eosinophils Relative: 5 %
HCT: 46.3 % — ABNORMAL HIGH (ref 36.0–46.0)
Hemoglobin: 16.5 g/dL — ABNORMAL HIGH (ref 12.0–15.0)
Immature Granulocytes: 1 %
Lymphocytes Relative: 14 %
Lymphs Abs: 1 10*3/uL (ref 0.7–4.0)
MCH: 31.1 pg (ref 26.0–34.0)
MCHC: 35.6 g/dL (ref 30.0–36.0)
MCV: 87.2 fL (ref 80.0–100.0)
Monocytes Absolute: 0.6 10*3/uL (ref 0.1–1.0)
Monocytes Relative: 9 %
Neutro Abs: 4.9 10*3/uL (ref 1.7–7.7)
Neutrophils Relative %: 70 %
Platelets: 238 10*3/uL (ref 150–400)
RBC: 5.31 MIL/uL — ABNORMAL HIGH (ref 3.87–5.11)
RDW: 12.7 % (ref 11.5–15.5)
WBC: 7 10*3/uL (ref 4.0–10.5)
nRBC: 0 % (ref 0.0–0.2)

## 2022-11-24 LAB — COMPREHENSIVE METABOLIC PANEL
ALT: 143 U/L — ABNORMAL HIGH (ref 0–44)
AST: 103 U/L — ABNORMAL HIGH (ref 15–41)
Albumin: 4.2 g/dL (ref 3.5–5.0)
Alkaline Phosphatase: 90 U/L (ref 38–126)
Anion gap: 9 (ref 5–15)
BUN: 21 mg/dL (ref 8–23)
CO2: 26 mmol/L (ref 22–32)
Calcium: 9.5 mg/dL (ref 8.9–10.3)
Chloride: 101 mmol/L (ref 98–111)
Creatinine, Ser: 0.77 mg/dL (ref 0.44–1.00)
GFR, Estimated: >60 mL/min (ref >60)
Glucose, Bld: 132 mg/dL — ABNORMAL HIGH (ref 70–99)
Potassium: 3.8 mmol/L (ref 3.5–5.1)
Sodium: 136 mmol/L (ref 135–145)
Total Bilirubin: 0.4 mg/dL (ref 0.3–1.2)
Total Protein: 7.3 g/dL (ref 6.5–8.1)

## 2022-11-24 LAB — LIPID PANEL
Cholesterol: 274 mg/dL — ABNORMAL HIGH (ref 0–200)
HDL: 57 mg/dL (ref >40)
LDL Cholesterol: UNDETERMINED mg/dL (ref 0–99)
Total CHOL/HDL Ratio: 4.8 {ratio}
Triglycerides: 426 mg/dL — ABNORMAL HIGH (ref <150)
VLDL: UNDETERMINED mg/dL (ref 0–40)

## 2022-11-24 NOTE — Progress Notes (Signed)
 No questions/concerns today.
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
## 2022-11-25 ENCOUNTER — Telehealth: Payer: Self-pay | Admitting: *Deleted

## 2022-11-25 LAB — LDL CHOLESTEROL, DIRECT: Direct LDL: 180 mg/dL — ABNORMAL HIGH (ref 0–99)

## 2022-11-25 NOTE — Telephone Encounter (Addendum)
Left message for Phynix to call office and schedule fasting labs in about 3 months to recheck her Lipid Panel per Consuello Masse, NP request at the [facility].

## 2022-11-25 NOTE — Telephone Encounter (Signed)
-----   Message from Kerby Nora sent at 11/25/2022  1:57 PM EDT ----- Check with the patient, as I am confused about what is going on.    I received a request from  Consuello Masse, NP.  I had her to our lab schedule to recheck lipids.  Per her note below she asked [place] to do it in the next few months.  Does patient prefer to do this all at the cancer center?  That would probably be the easiest! ----- Message ----- From: Damita Lack, CMA Sent: 11/25/2022  10:00 AM EDT To: Excell Seltzer, MD  Dvora is already scheduled at the [facility] for labs in 6 months.  Lupita Leash ----- Message ----- From: Excell Seltzer, MD Sent: 11/24/2022   7:03 PM EDT To: Bedsole Pool   Call patient.. cancer center request labs done here in the next few months.. make appt for labs eval. Labs already entered but I can re-orer if required for our lab. ----- Message ----- From: Alinda Dooms, NP Sent: 11/24/2022   7:00 PM EDT To: Excell Seltzer, MD  If you could recheck labs in anytime in the next few months and perform them fasting that would be great? Thank you! ----- Message ----- From: Excell Seltzer, MD Sent: 11/24/2022   5:22 PM EDT To: Alinda Dooms, NP  I would be happy to see her.. I see you have ordered some labs today..have you already taken care of this? Or are you wanting me to recheck lipids in 3 months? ----- Message ----- From: Alinda Dooms, NP Sent: 11/24/2022   5:18 PM EDT To: Excell Seltzer, MD  Hi Dr Ermalene Searing,  Would you be able to see this patient to check fasting lipids? Her triglycerides were quite elevated when we saw her and I suspect not quite this high. She had declined statins but you might have other management options for her or be able to follow her for dietary management? Thank you!  [name]

## 2022-11-26 ENCOUNTER — Encounter: Payer: Self-pay | Admitting: Family Medicine

## 2022-11-26 ENCOUNTER — Encounter: Payer: Self-pay | Admitting: Nurse Practitioner

## 2022-11-26 NOTE — Telephone Encounter (Signed)
Fasting lab appointment scheduled for 02/24/2023 at 11:00 am.

## 2022-11-27 ENCOUNTER — Telehealth: Payer: Self-pay

## 2022-11-27 ENCOUNTER — Other Ambulatory Visit: Payer: Self-pay

## 2022-11-27 NOTE — Telephone Encounter (Signed)
Referral to [facility] for weight management has been placed and faxed over.

## 2022-12-09 ENCOUNTER — Encounter: Payer: Self-pay | Admitting: Family Medicine

## 2022-12-09 ENCOUNTER — Ambulatory Visit (INDEPENDENT_AMBULATORY_CARE_PROVIDER_SITE_OTHER): Payer: Medicare PPO | Admitting: Family Medicine

## 2022-12-09 VITALS — BP 120/84 | HR 85 | Temp 98.2°F | Ht 61.0 in | Wt 153.0 lb

## 2022-12-09 DIAGNOSIS — L03313 Cellulitis of chest wall: Secondary | ICD-10-CM | POA: Diagnosis not present

## 2022-12-09 MED ORDER — DOXYCYCLINE HYCLATE 100 MG PO TABS: 100.0000 mg | ORAL_TABLET | Freq: Two times a day (BID) | ORAL | 0 refills | Status: DC

## 2022-12-09 NOTE — Progress Notes (Signed)
Patient ID: Katie Dennis, female    DOB: 10/27/1955, 67 y.o.   MRN: [id number]  This visit was conducted in person.  BP 120/84 (BP Location: Left Arm, Patient Position: Sitting, Cuff Size: Normal)   Pulse 85   Temp 98.2 F (36.8 C) (Oral)   Ht 5\' 1"  (1.549 m)   Wt 153 lb (69.4 kg)   SpO2 95%   BMI 28.91 kg/m    CC:  Chief Complaint  Patient presents with   Breast Mass    Under right breast x 1 week. Patient states lesion is very sore and has white drainage.    Subjective:   HPI: Katie Dennis is a 67 y.o. female presenting on 12/09/2022 for Breast Mass (Under right breast x 1 week. Patient states lesion is very sore and has white drainage.)  Lump under right breast ongoing in the last week.  Area is very sore inflamed and has had a white-colored drainage now in last 24 hours.  Started as little pimple under the area.. improved with lume deodorant.   Gradually worsening, increasing in pain.   No fever, no flu like symptoms.  She denies fever. She has been treating this with antibiotic cream.  Initially treated with antifungal.  History of invasive ductal carcinoma of left breast, currently treated with aromatase inhibitor. 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  No known MRSA exposure, but within the last year multiple medical visits for breast cancer treatment.      Relevant past medical, surgical, family and social history reviewed and updated as indicated. Interim medical history since our last visit reviewed. Allergies and medications reviewed and updated. Outpatient Medications Prior to Visit  Medication Sig Dispense Refill   calcium carbonate (OS-CAL - DOSED IN MG OF ELEMENTAL CALCIUM) 1250 (500 Ca) MG tablet Take 1 tablet by mouth 2 (two) times daily with a meal.     cyclobenzaprine (FLEXERIL) 10 MG tablet TAKE 1 TO 2 TABLETS BY MOUTH DAILY AT BEDTIME 60 tablet 1   DULoxetine (CYMBALTA) 60 MG capsule TAKE 1 CAPSULE (60 MG TOTAL) BY MOUTH AT BEDTIME. 90 capsule 1    ferrous sulfate 325 (65 FE) MG EC tablet Take 325 mg by mouth in the morning and at bedtime.     hydrochlorothiazide (HYDRODIURIL) 25 MG tablet TAKE 1 TABLET BY MOUTH EVERY DAY 90 tablet 3   letrozole (FEMARA) 2.5 MG tablet TAKE 1 TABLET BY MOUTH EVERY DAY 90 tablet 3   losartan (COZAAR) 100 MG tablet TAKE 1 TABLET BY MOUTH EVERY DAY 90 tablet 3   Multiple Vitamins-Minerals (MULTIVITAMIN WOMEN PO) Take by mouth daily.     traMADol (ULTRAM) 50 MG tablet Take 1 tablet (50 mg total) by mouth 2 (two) times daily as needed. 60 tablet 0   No facility-administered medications prior to visit.     Per HPI unless specifically indicated in ROS section below Review of Systems  Constitutional:  Negative for fatigue and fever.  HENT:  Negative for congestion.   Eyes:  Negative for pain.  Respiratory:  Negative for cough and shortness of breath.   Cardiovascular:  Negative for chest pain, palpitations and leg swelling.  Gastrointestinal:  Negative for abdominal pain.  Genitourinary:  Negative for dysuria and vaginal bleeding.  Musculoskeletal:  Negative for back pain.  Neurological:  Negative for syncope, light-headedness and headaches.  Psychiatric/Behavioral:  Negative for dysphoric mood.    Objective:  BP 120/84 (BP Location: Left Arm, Patient Position: Sitting, Cuff  Size: Normal)   Pulse 85   Temp 98.2 F (36.8 C) (Oral)   Ht 5\' 1"  (1.549 m)   Wt 153 lb (69.4 kg)   SpO2 95%   BMI 28.91 kg/m   Wt Readings from Last 3 Encounters:  12/09/22 153 lb (69.4 kg)  11/24/22 154 lb 12.8 oz (70.2 kg)  09/14/22 147 lb 12.8 oz (67 kg)      Physical Exam Constitutional:      General: She is not in acute distress.    Appearance: Normal appearance. She is well-developed. She is not ill-appearing or toxic-appearing.  HENT:     Head: Normocephalic.     Right Ear: Hearing, tympanic membrane, ear canal and external ear normal. Tympanic membrane is not erythematous, retracted or bulging.     Left  Ear: Hearing, tympanic membrane, ear canal and external ear normal. Tympanic membrane is not erythematous, retracted or bulging.     Nose: No mucosal edema or rhinorrhea.     Right Sinus: No maxillary sinus tenderness or frontal sinus tenderness.     Left Sinus: No maxillary sinus tenderness or frontal sinus tenderness.     Mouth/Throat:     Mouth: Oropharynx is clear and moist and mucous membranes are normal.     Pharynx: Uvula midline.  Eyes:     General: Lids are normal. Lids are everted, no foreign bodies appreciated.     Extraocular Movements: EOM normal.     Conjunctiva/sclera: Conjunctivae normal.     Pupils: Pupils are equal, round, and reactive to light.  Neck:     Thyroid: No thyroid mass or thyromegaly.     Vascular: No carotid bruit.     Trachea: Trachea normal.  Cardiovascular:     Rate and Rhythm: Normal rate and regular rhythm.     Pulses: Normal pulses.     Heart sounds: Normal heart sounds, S1 normal and S2 normal. No murmur heard.    No friction rub. No gallop.  Pulmonary:     Effort: Pulmonary effort is normal. No tachypnea or respiratory distress.     Breath sounds: Normal breath sounds. No decreased breath sounds, wheezing, rhonchi or rales.  Chest:  Breasts:    Right: No inverted nipple, mass, nipple discharge, skin change or tenderness.  Abdominal:     General: Bowel sounds are normal.     Palpations: Abdomen is soft.     Tenderness: There is no abdominal tenderness.  Musculoskeletal:     Cervical back: Normal range of motion and neck supple.  Lymphadenopathy:     Upper Body:     Right upper body: No supraclavicular, axillary or pectoral adenopathy.  Skin:    General: Skin is warm, dry and intact.     Findings: No rash.     Comments: See photo  Neurological:     Mental Status: She is alert.  Psychiatric:        Mood and Affect: Mood is not anxious or depressed.        Speech: Speech normal.        Behavior: Behavior normal. Behavior is cooperative.         Thought Content: Thought content normal.        Cognition and Memory: Cognition and memory normal.        Judgment: Judgment normal.       Results for orders placed or performed in visit on 11/24/22  Lipid panel  Result Value Ref Range   Cholesterol 274 (H)  0 - 200 mg/dL   Triglycerides 601 (H) <150 mg/dL   HDL 57 >09 mg/dL   Total CHOL/HDL Ratio 4.8 RATIO   VLDL UNABLE TO CALCULATE IF TRIGLYCERIDE OVER 400 mg/dL 0 - 40 mg/dL   LDL Cholesterol UNABLE TO CALCULATE IF TRIGLYCERIDE OVER 400 mg/dL 0 - 99 mg/dL  Comprehensive metabolic panel  Result Value Ref Range   Sodium 136 135 - 145 mmol/L   Potassium 3.8 3.5 - 5.1 mmol/L   Chloride 101 98 - 111 mmol/L   CO2 26 22 - 32 mmol/L   Glucose, Bld 132 (H) 70 - 99 mg/dL   BUN 21 8 - 23 mg/dL   Creatinine, Ser 3.23 0.44 - 1.00 mg/dL   Calcium 9.5 8.9 - 55.7 mg/dL   Total Protein 7.3 6.5 - 8.1 g/dL   Albumin 4.2 3.5 - 5.0 g/dL   AST 322 (H) 15 - 41 U/L   ALT 143 (H) 0 - 44 U/L   Alkaline Phosphatase 90 38 - 126 U/L   Total Bilirubin 0.4 0.3 - 1.2 mg/dL   GFR, Estimated >02 >54 mL/min   Anion gap 9 5 - 15  CBC with Differential/Platelet  Result Value Ref Range   WBC 7.0 4.0 - 10.5 K/uL   RBC 5.31 (H) 3.87 - 5.11 MIL/uL   Hemoglobin 16.5 (H) 12.0 - 15.0 g/dL   HCT 27.0 (H) 62.3 - 76.2 %   MCV 87.2 80.0 - 100.0 fL   MCH 31.1 26.0 - 34.0 pg   MCHC 35.6 30.0 - 36.0 g/dL   RDW 83.1 51.7 - 61.6 %   Platelets 238 150 - 400 K/uL   nRBC 0.0 0.0 - 0.2 %   Neutrophils Relative % 70 %   Neutro Abs 4.9 1.7 - 7.7 K/uL   Lymphocytes Relative 14 %   Lymphs Abs 1.0 0.7 - 4.0 K/uL   Monocytes Relative 9 %   Monocytes Absolute 0.6 0.1 - 1.0 K/uL   Eosinophils Relative 5 %   Eosinophils Absolute 0.4 0.0 - 0.5 K/uL   Basophils Relative 1 %   Basophils Absolute 0.1 0.0 - 0.1 K/uL   Immature Granulocytes 1 %   Abs Immature Granulocytes 0.07 0.00 - 0.07 K/uL  LDL cholesterol, direct  Result Value Ref Range   Direct LDL 180 (H) 0 - 99  mg/dL    Assessment and Plan  Cellulitis of chest wall Assessment & Plan: Acute, was able to easily unroofed small pustule at center of lesion with wound culture swab.  No fluctuance necessitating incision and drainage.  Recommend warm compresses 3 times daily.  Will prescribe doxycycline 100 mg p.o. twice daily.  Send wound culture for identification of bacteria. Patient will contact me if symptoms are not improving in the next 48 to 72 hours.  She is going on a trip and will be seen sooner if redness swelling, fever or unable to tolerate antibiotics.  Return and ER precautions reviewed in detail.  Orders: -     WOUND CULTURE  Other orders -     Doxycycline Hyclate; Take 1 tablet (100 mg total) by mouth 2 (two) times daily.  Dispense: 20 tablet; Refill: 0    No follow-ups on file.   Kerby Nora, MD

## 2022-12-09 NOTE — Assessment & Plan Note (Addendum)
Acute, was able to easily unroofed small pustule at center of lesion with wound culture swab.  No fluctuance necessitating incision and drainage.  Recommend warm compresses 3 times daily.  Will prescribe doxycycline 100 mg p.o. twice daily.  Send wound culture for identification of bacteria sent. Patient will contact me if symptoms are not improving in the next 48 to 72 hours.  She is going on a trip and will be seen sooner if redness swelling, fever or unable to tolerate antibiotics.  Return and ER precautions reviewed in detail.

## 2022-12-11 ENCOUNTER — Encounter: Payer: Self-pay | Admitting: Family Medicine

## 2022-12-11 MED ORDER — SULFAMETHOXAZOLE-TRIMETHOPRIM 800-160 MG PO TABS: 2.0000 | ORAL_TABLET | Freq: Two times a day (BID) | ORAL | 0 refills | Status: AC

## 2022-12-11 NOTE — Addendum Note (Signed)
Addended by: Kerby Nora E on: 12/11/2022 04:56 PM   Modules accepted: Orders

## 2022-12-12 LAB — WOUND CULTURE
MICRO NUMBER:: 15664048
SPECIMEN QUALITY:: ADEQUATE

## 2022-12-16 ENCOUNTER — Encounter: Payer: Self-pay | Admitting: *Deleted

## 2022-12-16 ENCOUNTER — Telehealth: Payer: Self-pay | Admitting: *Deleted

## 2022-12-16 NOTE — Telephone Encounter (Signed)
-----   Message from Kerby Nora sent at 12/15/2022  5:34 PM EST ----- Have patient work on low-cholesterol diet regular exercise and weight management.  Have her make an appointment in 3 months for fasting reevaluation of cholesterol with an appointment following with me to discuss possible treatment. ----- Message ----- From: Excell Seltzer, MD Sent: 12/15/2022   5:32 PM EST To: Excell Seltzer, MD  Have patient work on low-cholesterol diet regular exercise and weight management.  Have her make an appointment in 3 months for fasting reevaluation of cholesterol with an appointment following with me to discuss possible treatment. ----- Message ----- From: Alinda Dooms, NP Sent: 12/15/2022   9:36 AM EST To: Excell Seltzer, MD  If you'd be able to follow her for this I'd appreciate it? LDL was drawn today but not fasting. I suspect she's going to need medication. Thank you! ----- Message ----- From: Excell Seltzer, MD Sent: 11/24/2022   5:22 PM EST To: Alinda Dooms, NP  I would be happy to see her.. I see you have ordered some labs today..have you already taken care of this? Or are you wanting me to recheck lipids in 3 months? ----- Message ----- From: Alinda Dooms, NP Sent: 11/24/2022   5:18 PM EDT To: Excell Seltzer, MD  Hi Dr Ermalene Searing,  Would you be able to see this patient to check fasting lipids? Her triglycerides were quite elevated when we saw her and I suspect not quite this high. She had declined statins but you might have other management options for her or be able to follow her for dietary management? Thank you!  [name]

## 2022-12-16 NOTE — Telephone Encounter (Signed)
Left message for Branden to return call to office.

## 2022-12-16 NOTE — Telephone Encounter (Signed)
This has alredy been discussed with Katie Dennis and a fasting lab appointment has been scheduled for 02/24/2023.  I have sent her a MyChart message today now asking her to schedule and appointment with Dr. Ermalene Searing after she has her Lipid Panel drawn to discuss results and possible treatment.  I also reminded her to work on a low-cholesterol diet regular exercise and weight management prior to her lab appointment.  FYI to Dr. Ermalene Searing.

## 2022-12-23 DIAGNOSIS — Z6828 Body mass index (BMI) 28.0-28.9, adult: Secondary | ICD-10-CM | POA: Diagnosis not present

## 2022-12-23 DIAGNOSIS — I10 Essential (primary) hypertension: Secondary | ICD-10-CM | POA: Diagnosis not present

## 2022-12-23 DIAGNOSIS — R7303 Prediabetes: Secondary | ICD-10-CM | POA: Diagnosis not present

## 2022-12-23 DIAGNOSIS — E8889 Other specified metabolic disorders: Secondary | ICD-10-CM | POA: Diagnosis not present

## 2022-12-23 DIAGNOSIS — Z853 Personal history of malignant neoplasm of breast: Secondary | ICD-10-CM | POA: Diagnosis not present

## 2022-12-23 DIAGNOSIS — E663 Overweight: Secondary | ICD-10-CM | POA: Diagnosis not present

## 2022-12-23 DIAGNOSIS — D509 Iron deficiency anemia, unspecified: Secondary | ICD-10-CM | POA: Diagnosis not present

## 2022-12-23 DIAGNOSIS — Z1331 Encounter for screening for depression: Secondary | ICD-10-CM | POA: Diagnosis not present

## 2022-12-23 DIAGNOSIS — E785 Hyperlipidemia, unspecified: Secondary | ICD-10-CM | POA: Diagnosis not present

## 2023-01-03 ENCOUNTER — Encounter (HOSPITAL_COMMUNITY): Payer: Self-pay

## 2023-01-03 ENCOUNTER — Emergency Department (HOSPITAL_COMMUNITY)
Admission: EM | Admit: 2023-01-03 | Discharge: 2023-01-03 | Disposition: A | Discharge status: Home / Self Care | Payer: Medicare PPO | Attending: Emergency Medicine | Admitting: Emergency Medicine

## 2023-01-03 ENCOUNTER — Emergency Department (HOSPITAL_COMMUNITY): Payer: Medicare PPO

## 2023-01-03 DIAGNOSIS — S59912A Unspecified injury of left forearm, initial encounter: Secondary | ICD-10-CM | POA: Diagnosis present

## 2023-01-03 DIAGNOSIS — W268XXA Contact with other sharp object(s), not elsewhere classified, initial encounter: Secondary | ICD-10-CM | POA: Diagnosis not present

## 2023-01-03 DIAGNOSIS — S51812A Laceration without foreign body of left forearm, initial encounter: Secondary | ICD-10-CM | POA: Diagnosis not present

## 2023-01-03 DIAGNOSIS — Z23 Encounter for immunization: Secondary | ICD-10-CM | POA: Diagnosis not present

## 2023-01-03 DIAGNOSIS — M79603 Pain in arm, unspecified: Secondary | ICD-10-CM | POA: Diagnosis not present

## 2023-01-03 DIAGNOSIS — R739 Hyperglycemia, unspecified: Secondary | ICD-10-CM | POA: Diagnosis not present

## 2023-01-03 DIAGNOSIS — W19XXXA Unspecified fall, initial encounter: Secondary | ICD-10-CM | POA: Diagnosis not present

## 2023-01-03 DIAGNOSIS — R0902 Hypoxemia: Secondary | ICD-10-CM | POA: Diagnosis not present

## 2023-01-03 DIAGNOSIS — R58 Hemorrhage, not elsewhere classified: Secondary | ICD-10-CM | POA: Diagnosis not present

## 2023-01-03 MED ORDER — BACITRACIN ZINC 500 UNIT/GM EX OINT: TOPICAL_OINTMENT | Freq: Two times a day (BID) | CUTANEOUS | Status: DC

## 2023-01-03 MED ORDER — TETANUS-DIPHTH-ACELL PERTUSSIS 5-2.5-18.5 LF-MCG/0.5 IM SUSY
0.5000 mL | PREFILLED_SYRINGE | Freq: Once | INTRAMUSCULAR | Status: AC
  Administered 2023-01-03: 0.5 mL via INTRAMUSCULAR
  Filled 2023-01-03: qty 0.5

## 2023-01-03 MED ORDER — SODIUM CHLORIDE 0.9 % IV BOLUS
1000.0000 mL | Freq: Once | INTRAVENOUS | Status: AC
  Administered 2023-01-03: 1000 mL via INTRAVENOUS

## 2023-01-03 MED ORDER — LIDOCAINE-EPINEPHRINE (PF) 2 %-1:200000 IJ SOLN
10.0000 mL | Freq: Once | INTRAMUSCULAR | Status: AC
  Administered 2023-01-03: 10 mL
  Filled 2023-01-03: qty 20

## 2023-01-03 MED ORDER — BACITRACIN ZINC 500 UNIT/GM EX OINT: 1.0000 | TOPICAL_OINTMENT | Freq: Two times a day (BID) | CUTANEOUS | 0 refills | Status: DC

## 2023-01-03 NOTE — ED Provider Notes (Signed)
[place] EMERGENCY DEPARTMENT AT [facility] Provider Note   CSN: [id number] Arrival date & time: 01/03/23  1544     History  Chief Complaint  Patient presents with   Extremity Laceration    Katie Dennis is a 67 y.o. female.  HPI    SUBJECTIVE:  67 y.o. female sustained laceration of arm prior to emergency room arrival. Ashby Dawes of injury: Cut from ceramic plate or bowl that broke. Tetanus vaccination status reviewed: Td vaccination indicated and given today.  Patient complains of slight discomfort around the injury.  She indicates that blood was squirting out initially, and paramedics had to apply pressure dressing.  Patient does not take any blood thinners.   OBJECTIVE:  Patient appears well, vitals are normal. Laceration 6 cm noted.  Description: clean wound edges, no foreign bodies. Neurovascular and tendon structures are intact.   Home Medications Prior to Admission medications   Medication Sig Start Date End Date Taking? Authorizing Provider  bacitracin ointment Apply 1 Application topically 2 (two) times daily. 01/03/23  Yes Derwood Kaplan, MD  calcium carbonate (OS-CAL - DOSED IN MG OF ELEMENTAL CALCIUM) 1250 (500 Ca) MG tablet Take 1 tablet by mouth 2 (two) times daily with a meal.    [provider]  cyclobenzaprine (FLEXERIL) 10 MG tablet TAKE 1 TO 2 TABLETS BY MOUTH DAILY AT BEDTIME 08/18/22   Bedsole, Amy E, MD  doxycycline (VIBRA-TABS) 100 MG tablet Take 1 tablet (100 mg total) by mouth 2 (two) times daily. 12/09/22   Bedsole, Amy E, MD  DULoxetine (CYMBALTA) 60 MG capsule TAKE 1 CAPSULE (60 MG TOTAL) BY MOUTH AT BEDTIME. 07/29/22   Bedsole, Amy E, MD  ferrous sulfate 325 (65 FE) MG EC tablet Take 325 mg by mouth in the morning and at bedtime.    [provider]  hydrochlorothiazide (HYDRODIURIL) 25 MG tablet TAKE 1 TABLET BY MOUTH EVERY DAY 07/29/22   Ermalene Searing, Amy E, MD  letrozole [facility]) 2.5 MG tablet TAKE 1 TABLET BY MOUTH EVERY DAY  03/27/22   [place], Brand Males, PA-C  losartan (COZAAR) 100 MG tablet TAKE 1 TABLET BY MOUTH EVERY DAY 09/17/22   Bedsole, Amy E, MD  Multiple Vitamins-Minerals (MULTIVITAMIN WOMEN PO) Take by mouth daily.    [provider]  traMADol (ULTRAM) 50 MG tablet Take 1 tablet (50 mg total) by mouth 2 (two) times daily as needed. 09/23/22   Excell Seltzer, MD      Allergies    Patient has no known allergies.    Review of Systems   Review of Systems  Physical Exam Updated Vital Signs BP 115/71   Pulse 97   Temp 97.9 F (36.6 C) (Oral)   Resp 16   SpO2 99%  Physical Exam Vitals and nursing note reviewed.  Constitutional:      Appearance: She is well-developed.  HENT:     Head: Atraumatic.  Cardiovascular:     Rate and Rhythm: Normal rate.  Pulmonary:     Effort: Pulmonary effort is normal.  Musculoskeletal:     Cervical back: Neck supple.  Skin:    General: Skin is dry.     Comments: Left forearm laceration as described -6 cm, subcu fat exposed, bleeding has ceased  Neurological:     Mental Status: She is alert and oriented to person, place, and time.     ED Results / Procedures / Treatments   Labs (all labs ordered are listed, but only abnormal results are  displayed) Labs Reviewed - No data to display  EKG None  Radiology DG Forearm Left  Result Date: 01/03/2023 CLINICAL DATA:  laceration from ceramic bowl EXAM: LEFT FOREARM - 2 VIEW COMPARISON:  None Available. FINDINGS: No acute fracture or dislocation. Joint spaces and alignment are maintained. No area of erosion or osseous destruction. No unexpected radiopaque foreign body. Soft tissues are unremarkable. IMPRESSION: No acute fracture or dislocation. No unexpected radiopaque foreign body. Electronically Signed   By: Meda Klinefelter M.D.   On: 01/03/2023 16:28    Procedures .Marland KitchenLaceration Repair  Date/Time: 01/03/2023 7:24 PM  Performed by: Derwood Kaplan, MD Authorized by: Derwood Kaplan, MD   Consent:     Consent obtained:  Verbal   Consent given by:  Parent and patient   Risks, benefits, and alternatives were discussed: yes     Risks discussed:  Infection, pain and poor wound healing   Alternatives discussed:  No treatment Universal protocol:    Procedure explained and questions answered to patient or proxy's satisfaction: yes     Immediately prior to procedure, a time out was called: yes     Patient identity confirmed:  Arm band Anesthesia:    Anesthesia method:  Local infiltration   Local anesthetic:  Lidocaine 1% WITH epi Laceration details:    Location:  Shoulder/arm   Shoulder/arm location:  L lower arm   Length (cm):  6   Depth (mm):  10 Pre-procedure details:    Preparation:  Patient was prepped and draped in usual sterile fashion Exploration:    Limited defect created (wound extended): no     Hemostasis achieved with:  Direct pressure   Imaging obtained: x-ray     Imaging outcome: foreign body not noted     Wound exploration: wound explored through full range of motion     Wound extent: areolar tissue violated and fascia violated     Wound extent: no foreign body, no signs of injury and no tendon damage     Contaminated: yes   Treatment:    Area cleansed with:  Saline   Amount of cleaning:  Extensive   Irrigation solution:  Sterile saline   Irrigation volume:  50   Irrigation method:  Pressure wash   Debridement:  None   Undermining:  None   Scar revision: no   Skin repair:    Repair method:  Sutures   Suture size:  3-0   Suture material:  Nylon   Suture technique:  Simple interrupted   Number of sutures:  7 Approximation:    Approximation:  Close Repair type:    Repair type:  Simple Post-procedure details:    Dressing:  Antibiotic ointment   Procedure completion:  Tolerated well, no immediate complications     Medications Ordered in ED Medications  bacitracin ointment (has no administration in time range)  sodium chloride 0.9 % bolus 1,000 mL (0  mLs Intravenous Stopped 01/03/23 1740)  Tdap (BOOSTRIX) injection 0.5 mL (0.5 mLs Intramuscular Given 01/03/23 1739)  lidocaine-EPINEPHrine (XYLOCAINE W/EPI) 2 %-1:200000 (PF) injection 10 mL (10 mLs Infiltration Given 01/03/23 1739)    ED Course/ Medical Decision Making/ A&P                                 Medical Decision Making Amount and/or Complexity of Data Reviewed Radiology: ordered.  Risk OTC drugs. Prescription drug management.   67 year old patient comes in with chief complaint  of laceration.  Patient was cut by a ceramic Utensil, had significant bleeding at scene.  Bleeding has stopped with pressure dressing applied by EMS.  Patient is not up-to-date with tetanus.  She is neurovascularly intact.  On exam, patient has a 6 cm laceration that is deep. Differential diagnosis for her includes simple laceration, tendon injury, vascular injury, foreign body.  X-ray of the forearm was ordered and independently interpreted.  There is no evidence of foreign body.  Wound was cleansed thoroughly and then repaired.  Final Clinical Impression(s) / ED Diagnoses Final diagnoses:  Laceration of left forearm, initial encounter    Rx / DC Orders ED Discharge Orders          Ordered    bacitracin ointment  2 times daily        01/03/23 1919              Derwood Kaplan, MD 01/03/23 1927

## 2023-01-03 NOTE — ED Notes (Signed)
Bacitracin ointment placed on wound with a non adherent covering. Pt also ambulated to bathroom with minimal assistance.

## 2023-01-03 NOTE — Discharge Instructions (Addendum)
We saw you in the ER for your WOUND. The wound has been cleaned and sutured. Please read the instructions provided on wound care. Keep the area clean and dry, apply bacitracin ointment daily and take the medications provided. RETURN TO THE ER IF THERE IS INCREASED PAIN, REDNESS, PUS COMING OUT from the wound site. Sutures can be removed by your primary care doctor in 7 to 10 days. If PCP cannot remove the sutures you may decide to go to an urgent care or come back to the emergency room.

## 2023-01-03 NOTE — ED Triage Notes (Signed)
Coming from home, she was getting a ceramic bowl out of the cabinet, bowl fell and broke causing an left arm laceration. Rescue put a tourniquet on and it was for around 30 minutes, there was no arterial bleeding but the cut was bleeding a lot, which cause rescue to put on the tourniquet. Tourniquet was on for 30 minutes and removed by [facility]. Bleed has since stopped. It is a fulll thickness laceration. Pt is complaining of pain from the tourniquet but has full sensation in the hand with equal bilateral pulses. Laceration is bandaged up at this time.    18g rt AC NS  92/56 250bl 96%ra

## 2023-01-12 ENCOUNTER — Encounter: Payer: Self-pay | Admitting: Family Medicine

## 2023-01-12 ENCOUNTER — Ambulatory Visit: Payer: Medicare PPO | Admitting: Family Medicine

## 2023-01-12 VITALS — BP 130/90 | HR 88 | Temp 98.2°F | Ht 61.0 in | Wt 154.1 lb

## 2023-01-12 DIAGNOSIS — Z4802 Encounter for removal of sutures: Secondary | ICD-10-CM

## 2023-01-12 DIAGNOSIS — S51812D Laceration without foreign body of left forearm, subsequent encounter: Secondary | ICD-10-CM

## 2023-01-12 DIAGNOSIS — S51812A Laceration without foreign body of left forearm, initial encounter: Secondary | ICD-10-CM | POA: Insufficient documentation

## 2023-01-12 NOTE — Progress Notes (Signed)
Patient ID: Katie Dennis, female    DOB: [birth year], 67 y.o.   MRN: [id number]  This visit was conducted in person.  BP (!) 130/90 (BP Location: Right Arm, Patient Position: Sitting, Cuff Size: Normal)   Pulse 88   Temp 98.2 F (36.8 C) (Temporal)   Ht 5\' 1"  (1.549 m)   Wt 154 lb 2 oz (69.9 kg)   SpO2 95%   BMI 29.12 kg/m    CC:  Chief Complaint  Patient presents with   Suture / Staple Removal    Left Arm Forearm-7 sutures placed    Subjective:   HPI: Katie Dennis is a 67 y.o. female presenting on 01/12/2023 for Suture / Staple Removal (Left Arm Forearm-7 sutures placed)   A dish fell and cut left forearm... 1 week ago.  7 sutures placed.. here for removal.  No redness, minimal pain.  No discharge, no fever.       Relevant past medical, surgical, family and social history reviewed and updated as indicated. Interim medical history since our last visit reviewed. Allergies and medications reviewed and updated. Outpatient Medications Prior to Visit  Medication Sig Dispense Refill   bacitracin ointment Apply 1 Application topically 2 (two) times daily. 14 g 0   calcium carbonate (OS-CAL - DOSED IN MG OF ELEMENTAL CALCIUM) 1250 (500 Ca) MG tablet Take 1 tablet by mouth 2 (two) times daily with a meal.     cyclobenzaprine (FLEXERIL) 10 MG tablet TAKE 1 TO 2 TABLETS BY MOUTH DAILY AT BEDTIME 60 tablet 1   DULoxetine (CYMBALTA) 60 MG capsule TAKE 1 CAPSULE (60 MG TOTAL) BY MOUTH AT BEDTIME. 90 capsule 1   ferrous sulfate 325 (65 FE) MG EC tablet Take 325 mg by mouth in the morning and at bedtime.     hydrochlorothiazide (HYDRODIURIL) 25 MG tablet TAKE 1 TABLET BY MOUTH EVERY DAY 90 tablet 3   letrozole (FEMARA) 2.5 MG tablet TAKE 1 TABLET BY MOUTH EVERY DAY 90 tablet 3   losartan (COZAAR) 100 MG tablet TAKE 1 TABLET BY MOUTH EVERY DAY 90 tablet 3   Multiple Vitamins-Minerals (MULTIVITAMIN WOMEN PO) Take by mouth daily.     traMADol (ULTRAM) 50 MG tablet Take 1 tablet (50 mg  total) by mouth 2 (two) times daily as needed. 60 tablet 0   doxycycline (VIBRA-TABS) 100 MG tablet Take 1 tablet (100 mg total) by mouth 2 (two) times daily. 20 tablet 0   No facility-administered medications prior to visit.     Per HPI unless specifically indicated in ROS section below Review of Systems  Constitutional:  Negative for fatigue and fever.  HENT:  Negative for congestion.   Eyes:  Negative for pain.  Respiratory:  Negative for cough and shortness of breath.   Cardiovascular:  Negative for chest pain, palpitations and leg swelling.  Gastrointestinal:  Negative for abdominal pain.  Genitourinary:  Negative for dysuria and vaginal bleeding.  Musculoskeletal:  Negative for back pain.  Neurological:  Negative for syncope, light-headedness and headaches.  Psychiatric/Behavioral:  Negative for dysphoric mood.    Objective:  BP (!) 130/90 (BP Location: Right Arm, Patient Position: Sitting, Cuff Size: Normal)   Pulse 88   Temp 98.2 F (36.8 C) (Temporal)   Ht 5\' 1"  (1.549 m)   Wt 154 lb 2 oz (69.9 kg)   SpO2 95%   BMI 29.12 kg/m   Wt Readings from Last 3 Encounters:  01/12/23 154 lb 2 oz (69.9 kg)  12/09/22 153 lb (69.4 kg)  11/24/22 154 lb 12.8 oz (70.2 kg)      Physical Exam Skin:    Comments: Well-healing laceration to left forearm with 7 sutures in place       Results for orders placed or performed in visit on 12/09/22  WOUND CULTURE   Specimen: Wound  Result Value Ref Range   MICRO NUMBER: [id number]    SPECIMEN QUALITY: Adequate    SOURCE: WOUND (SITE NOT SPECIFIED)    STATUS: FINAL    GRAM STAIN:      Many White blood cells seen Moderate epithelial cells No organisms seen   ISOLATE 1: Proteus mirabilis (A)       Susceptibility   Proteus mirabilis - AEROBIC CULT, GRAM STAIN NEGATIVE 1    AMOX/CLAVULANIC <=2 Sensitive     AMPICILLIN <=2 Sensitive     AMPICILLIN/SULBACTAM <=2 Sensitive     CEFAZOLIN* <=4 Not Reportable      * For infections other than  uncomplicated UTI caused by E. coli, K. pneumoniae or P. mirabilis: Cefazolin is resistant if MIC > or = 8 mcg/mL. (Distinguishing susceptible versus intermediate for isolates with MIC < or = 4 mcg/mL requires additional testing.)     CEFTAZIDIME <=1 Sensitive     CEFEPIME <=1 Sensitive     CEFTRIAXONE <=1 Sensitive     CIPROFLOXACIN <=0.25 Sensitive     LEVOFLOXACIN <=0.12 Sensitive     GENTAMICIN <=1 Sensitive     IMIPENEM 2 Intermediate     PIP/TAZO <=4 Sensitive     TOBRAMYCIN <=1 Sensitive     TRIMETH/SULFA* <=20 Sensitive      * For infections other than uncomplicated UTI caused by E. coli, K. pneumoniae or P. mirabilis: Cefazolin is resistant if MIC > or = 8 mcg/mL. (Distinguishing susceptible versus intermediate for isolates with MIC < or = 4 mcg/mL requires additional testing.) Legend: S = Susceptible  I = Intermediate R = Resistant  NS = Not susceptible SDD = Susceptible Dose Dependent * = Not Tested  NR = Not Reported **NN = See Therapy Comments     Assessment and Plan  Laceration of left forearm, subsequent encounter Assessment & Plan: Procedure: Wound cleaned with alcohol and affected.  Minimal scabbing noted and wound edges well-approximated. 7 sutures removed without bleeding and minimal pain. Benzoin tincture applied and Steri-Strips placed.  No complications. Wound appears to be well-healing without infection.     No follow-ups on file.   Kerby Nora, MD

## 2023-01-12 NOTE — Assessment & Plan Note (Signed)
Procedure: Wound cleaned with alcohol and affected.  Minimal scabbing noted and wound edges well-approximated. 7 sutures removed without bleeding and minimal pain. Benzoin tincture applied and Steri-Strips placed.  No complications. Wound appears to be well-healing without infection.

## 2023-01-13 DIAGNOSIS — E785 Hyperlipidemia, unspecified: Secondary | ICD-10-CM | POA: Diagnosis not present

## 2023-01-13 DIAGNOSIS — E663 Overweight: Secondary | ICD-10-CM | POA: Diagnosis not present

## 2023-01-13 DIAGNOSIS — Z6828 Body mass index (BMI) 28.0-28.9, adult: Secondary | ICD-10-CM | POA: Diagnosis not present

## 2023-01-13 DIAGNOSIS — I10 Essential (primary) hypertension: Secondary | ICD-10-CM | POA: Diagnosis not present

## 2023-01-13 DIAGNOSIS — R7303 Prediabetes: Secondary | ICD-10-CM | POA: Diagnosis not present

## 2023-01-23 ENCOUNTER — Other Ambulatory Visit: Payer: Self-pay | Admitting: Family Medicine

## 2023-01-28 DIAGNOSIS — E785 Hyperlipidemia, unspecified: Secondary | ICD-10-CM | POA: Diagnosis not present

## 2023-01-28 DIAGNOSIS — E663 Overweight: Secondary | ICD-10-CM | POA: Diagnosis not present

## 2023-01-28 DIAGNOSIS — Z6827 Body mass index (BMI) 27.0-27.9, adult: Secondary | ICD-10-CM | POA: Diagnosis not present

## 2023-01-28 DIAGNOSIS — R7303 Prediabetes: Secondary | ICD-10-CM | POA: Diagnosis not present

## 2023-01-28 DIAGNOSIS — I10 Essential (primary) hypertension: Secondary | ICD-10-CM | POA: Diagnosis not present

## 2023-02-09 ENCOUNTER — Telehealth: Payer: Self-pay | Admitting: *Deleted

## 2023-02-09 DIAGNOSIS — E7800 Pure hypercholesterolemia, unspecified: Secondary | ICD-10-CM

## 2023-02-09 NOTE — Telephone Encounter (Signed)
-----   Message from Alvina Chou sent at 02/08/2023  4:02 PM EST ----- Regarding: lab orders for [facility], 1.15.25 Lab orders for lipid,? thanks

## 2023-02-11 DIAGNOSIS — E663 Overweight: Secondary | ICD-10-CM | POA: Diagnosis not present

## 2023-02-11 DIAGNOSIS — R7303 Prediabetes: Secondary | ICD-10-CM | POA: Diagnosis not present

## 2023-02-11 DIAGNOSIS — I10 Essential (primary) hypertension: Secondary | ICD-10-CM | POA: Diagnosis not present

## 2023-02-11 DIAGNOSIS — Z6827 Body mass index (BMI) 27.0-27.9, adult: Secondary | ICD-10-CM | POA: Diagnosis not present

## 2023-02-24 ENCOUNTER — Other Ambulatory Visit (INDEPENDENT_AMBULATORY_CARE_PROVIDER_SITE_OTHER): Payer: Medicare PPO

## 2023-02-24 DIAGNOSIS — E7800 Pure hypercholesterolemia, unspecified: Secondary | ICD-10-CM

## 2023-02-24 LAB — COMPREHENSIVE METABOLIC PANEL
ALT: 98 U/L — ABNORMAL HIGH (ref 0–35)
AST: 90 U/L — ABNORMAL HIGH (ref 0–37)
Albumin: 4.4 g/dL (ref 3.5–5.2)
Alkaline Phosphatase: 93 U/L (ref 39–117)
BUN: 13 mg/dL (ref 6–23)
CO2: 30 meq/L (ref 19–32)
Calcium: 9.9 mg/dL (ref 8.4–10.5)
Chloride: 100 meq/L (ref 96–112)
Creatinine, Ser: 0.87 mg/dL (ref 0.40–1.20)
GFR: 69.05 mL/min (ref >60.00)
Glucose, Bld: 104 mg/dL — ABNORMAL HIGH (ref 70–99)
Potassium: 3.6 meq/L (ref 3.5–5.1)
Sodium: 140 meq/L (ref 135–145)
Total Bilirubin: 0.6 mg/dL (ref 0.2–1.2)
Total Protein: 7.2 g/dL (ref 6.0–8.3)

## 2023-02-24 LAB — LIPID PANEL
Cholesterol: 243 mg/dL — ABNORMAL HIGH (ref 0–200)
HDL: 49.8 mg/dL (ref >39.00)
LDL Cholesterol: 162 mg/dL — ABNORMAL HIGH (ref 0–99)
NonHDL: 193.61
Total CHOL/HDL Ratio: 5
Triglycerides: 157 mg/dL — ABNORMAL HIGH (ref 0.0–149.0)
VLDL: 31.4 mg/dL (ref 0.0–40.0)

## 2023-02-25 ENCOUNTER — Other Ambulatory Visit: Payer: Self-pay | Admitting: Family Medicine

## 2023-02-25 ENCOUNTER — Encounter: Payer: Self-pay | Admitting: Family Medicine

## 2023-02-26 MED ORDER — ATORVASTATIN CALCIUM 10 MG PO TABS: 10.0000 mg | ORAL_TABLET | Freq: Every day | ORAL | 3 refills | Status: DC

## 2023-02-26 NOTE — Telephone Encounter (Signed)
Last office visit 01/12/23 for suture removal.  Last refilled 09/23/2022 for #60 with no refills.  Next Appt: No future appointment with PCP.

## 2023-03-08 DIAGNOSIS — E785 Hyperlipidemia, unspecified: Secondary | ICD-10-CM | POA: Diagnosis not present

## 2023-03-08 DIAGNOSIS — E663 Overweight: Secondary | ICD-10-CM | POA: Diagnosis not present

## 2023-03-08 DIAGNOSIS — R7303 Prediabetes: Secondary | ICD-10-CM | POA: Diagnosis not present

## 2023-03-08 DIAGNOSIS — I10 Essential (primary) hypertension: Secondary | ICD-10-CM | POA: Diagnosis not present

## 2023-03-08 DIAGNOSIS — Z6826 Body mass index (BMI) 26.0-26.9, adult: Secondary | ICD-10-CM | POA: Diagnosis not present

## 2023-03-14 ENCOUNTER — Other Ambulatory Visit: Payer: Self-pay | Admitting: Family Medicine

## 2023-03-15 NOTE — Telephone Encounter (Signed)
Last office visit 01/12/2023 for suture removal.  Last refilled 08/18/2022 for #60 with 1 refill.  Next appt: No future appointments with PCP.

## 2023-03-18 ENCOUNTER — Ambulatory Visit
Admission: RE | Admit: 2023-03-18 | Discharge: 2023-03-18 | Disposition: A | Discharge status: Home / Self Care | Payer: Medicare PPO | Source: Ambulatory Visit | Attending: Radiation Oncology | Admitting: Radiation Oncology

## 2023-03-18 ENCOUNTER — Encounter: Payer: Self-pay | Admitting: Radiation Oncology

## 2023-03-18 VITALS — BP 123/72 | HR 80 | Resp 16 | Ht 61.0 in | Wt 146.0 lb

## 2023-03-18 DIAGNOSIS — Z170 Estrogen receptor positive status [ER+]: Secondary | ICD-10-CM | POA: Insufficient documentation

## 2023-03-18 DIAGNOSIS — C50912 Malignant neoplasm of unspecified site of left female breast: Secondary | ICD-10-CM | POA: Diagnosis not present

## 2023-03-18 DIAGNOSIS — C50412 Malignant neoplasm of upper-outer quadrant of left female breast: Secondary | ICD-10-CM | POA: Diagnosis not present

## 2023-03-18 DIAGNOSIS — Z79811 Long term (current) use of aromatase inhibitors: Secondary | ICD-10-CM | POA: Diagnosis not present

## 2023-03-18 DIAGNOSIS — Z923 Personal history of irradiation: Secondary | ICD-10-CM | POA: Diagnosis not present

## 2023-03-18 NOTE — Progress Notes (Signed)
 Radiation Oncology Follow up Note  Name: Katie Dennis   Date:   03/18/2023 MRN:  [id number] DOB: [birth year]    This 68 y.o. 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  REFERRING PROVIDER: Avelina Greig BRAVO, MD  HPI: Patient is a 68 year old female now out 13 months having pleated whole breast radiation to her left breast for stage Ia ER/PR positive invasive mammary carcinoma.  Seen today in routine follow-up she is doing well.  She specifically denies breast tenderness cough or bone pain..  She had mammograms back in July which I have reviewed were BI-RADS 2 benign.  She is currently on Femara  tolerating it well without side effect.  COMPLICATIONS OF TREATMENT: none  FOLLOW UP COMPLIANCE: keeps appointments   PHYSICAL EXAM:  BP 123/72   Pulse 80   Resp 16   Ht 5' 1 (1.549 m)   Wt 146 lb (66.2 kg)   BMI 27.59 kg/m  Lungs are clear to A&P cardiac examination essentially unremarkable with regular rate and rhythm. No dominant mass or nodularity is noted in either breast in 2 positions examined. Incision is well-healed. No axillary or supraclavicular adenopathy is appreciated. Cosmetic result is excellent.  Well-developed well-nourished patient in NAD. HEENT reveals PERLA, EOMI, discs not visualized.  Oral cavity is clear. No oral mucosal lesions are identified. Neck is clear without evidence of cervical or supraclavicular adenopathy. Lungs are clear to A&P. Cardiac examination is essentially unremarkable with regular rate and rhythm without murmur rub or thrill. Abdomen is benign with no organomegaly or masses noted. Motor sensory and DTR levels are equal and symmetric in the upper and lower extremities. Cranial nerves II through XII are grossly intact. Proprioception is intact. No peripheral adenopathy or edema is identified. No motor or sensory levels are noted.  Crude visual fields are within normal range.  RADIOLOGY RESULTS: Grams reviewed compatible with above-stated findings  PLAN: Present time patient is doing well with no evidence of disease now out well over a year.  I have asked to see her back in 1 year for follow-up.  She continues on Femara  without side effect.  Patient knows to call with any concerns.  I would like to take this opportunity to thank you for allowing me to participate in the care of your patient.SABRA Marcey Penton, MD

## 2023-03-29 DIAGNOSIS — E663 Overweight: Secondary | ICD-10-CM | POA: Diagnosis not present

## 2023-03-29 DIAGNOSIS — I10 Essential (primary) hypertension: Secondary | ICD-10-CM | POA: Diagnosis not present

## 2023-03-29 DIAGNOSIS — E785 Hyperlipidemia, unspecified: Secondary | ICD-10-CM | POA: Diagnosis not present

## 2023-03-29 DIAGNOSIS — Z6826 Body mass index (BMI) 26.0-26.9, adult: Secondary | ICD-10-CM | POA: Diagnosis not present

## 2023-03-29 DIAGNOSIS — R7303 Prediabetes: Secondary | ICD-10-CM | POA: Diagnosis not present

## 2023-04-26 DIAGNOSIS — R7303 Prediabetes: Secondary | ICD-10-CM | POA: Diagnosis not present

## 2023-04-26 DIAGNOSIS — E785 Hyperlipidemia, unspecified: Secondary | ICD-10-CM | POA: Diagnosis not present

## 2023-04-26 DIAGNOSIS — I10 Essential (primary) hypertension: Secondary | ICD-10-CM | POA: Diagnosis not present

## 2023-04-26 DIAGNOSIS — E663 Overweight: Secondary | ICD-10-CM | POA: Diagnosis not present

## 2023-04-26 DIAGNOSIS — Z6825 Body mass index (BMI) 25.0-25.9, adult: Secondary | ICD-10-CM | POA: Diagnosis not present

## 2023-05-07 ENCOUNTER — Telehealth: Payer: Self-pay | Admitting: *Deleted

## 2023-05-07 MED ORDER — LETROZOLE 2.5 MG PO TABS: 2.5000 mg | ORAL_TABLET | Freq: Every day | ORAL | 3 refills | Status: AC

## 2023-05-07 NOTE — Telephone Encounter (Signed)
 Called pt to let her know the med is being sent today.

## 2023-05-13 ENCOUNTER — Other Ambulatory Visit: Payer: Self-pay | Admitting: Family Medicine

## 2023-05-13 NOTE — Telephone Encounter (Signed)
 Last office visit 01/12/2023 for Suture Removal.  Last refilled 03/16/2023 for #60 with 1 refill.  Next Appt:  No future appointments with PCP.

## 2023-05-24 DIAGNOSIS — E663 Overweight: Secondary | ICD-10-CM | POA: Diagnosis not present

## 2023-05-24 DIAGNOSIS — I10 Essential (primary) hypertension: Secondary | ICD-10-CM | POA: Diagnosis not present

## 2023-05-24 DIAGNOSIS — Z6824 Body mass index (BMI) 24.0-24.9, adult: Secondary | ICD-10-CM | POA: Diagnosis not present

## 2023-05-24 DIAGNOSIS — E785 Hyperlipidemia, unspecified: Secondary | ICD-10-CM | POA: Diagnosis not present

## 2023-05-24 DIAGNOSIS — R7303 Prediabetes: Secondary | ICD-10-CM | POA: Diagnosis not present

## 2023-05-25 ENCOUNTER — Inpatient Hospital Stay: Payer: Medicare PPO | Attending: Internal Medicine

## 2023-05-25 ENCOUNTER — Inpatient Hospital Stay

## 2023-05-25 ENCOUNTER — Encounter: Payer: Self-pay | Admitting: Internal Medicine

## 2023-05-25 ENCOUNTER — Inpatient Hospital Stay: Payer: Medicare PPO | Admitting: Internal Medicine

## 2023-05-25 VITALS — BP 115/59 | HR 77 | Temp 96.8°F | Resp 16 | Wt 136.0 lb

## 2023-05-25 DIAGNOSIS — Z87891 Personal history of nicotine dependence: Secondary | ICD-10-CM | POA: Diagnosis not present

## 2023-05-25 DIAGNOSIS — Z79811 Long term (current) use of aromatase inhibitors: Secondary | ICD-10-CM | POA: Diagnosis not present

## 2023-05-25 DIAGNOSIS — Z170 Estrogen receptor positive status [ER+]: Secondary | ICD-10-CM | POA: Insufficient documentation

## 2023-05-25 DIAGNOSIS — Z1721 Progesterone receptor positive status: Secondary | ICD-10-CM | POA: Insufficient documentation

## 2023-05-25 DIAGNOSIS — Z1732 Human epidermal growth factor receptor 2 negative status: Secondary | ICD-10-CM | POA: Diagnosis not present

## 2023-05-25 DIAGNOSIS — Z803 Family history of malignant neoplasm of breast: Secondary | ICD-10-CM | POA: Insufficient documentation

## 2023-05-25 DIAGNOSIS — Z79899 Other long term (current) drug therapy: Secondary | ICD-10-CM | POA: Diagnosis not present

## 2023-05-25 DIAGNOSIS — D751 Secondary polycythemia: Secondary | ICD-10-CM | POA: Insufficient documentation

## 2023-05-25 DIAGNOSIS — Z5181 Encounter for therapeutic drug level monitoring: Secondary | ICD-10-CM

## 2023-05-25 DIAGNOSIS — C50919 Malignant neoplasm of unspecified site of unspecified female breast: Secondary | ICD-10-CM | POA: Diagnosis not present

## 2023-05-25 DIAGNOSIS — M8580 Other specified disorders of bone density and structure, unspecified site: Secondary | ICD-10-CM | POA: Insufficient documentation

## 2023-05-25 DIAGNOSIS — Z08 Encounter for follow-up examination after completed treatment for malignant neoplasm: Secondary | ICD-10-CM

## 2023-05-25 DIAGNOSIS — C50912 Malignant neoplasm of unspecified site of left female breast: Secondary | ICD-10-CM

## 2023-05-25 LAB — CBC WITH DIFFERENTIAL (CANCER CENTER ONLY)
Abs Immature Granulocytes: 0.09 10*3/uL — ABNORMAL HIGH (ref 0.00–0.07)
Basophils Absolute: 0.1 10*3/uL (ref 0.0–0.1)
Basophils Relative: 1 %
Eosinophils Absolute: 0.3 10*3/uL (ref 0.0–0.5)
Eosinophils Relative: 4 %
HCT: 47.2 % — ABNORMAL HIGH (ref 36.0–46.0)
Hemoglobin: 16.2 g/dL — ABNORMAL HIGH (ref 12.0–15.0)
Immature Granulocytes: 1 %
Lymphocytes Relative: 15 %
Lymphs Abs: 1 10*3/uL (ref 0.7–4.0)
MCH: 28.9 pg (ref 26.0–34.0)
MCHC: 34.3 g/dL (ref 30.0–36.0)
MCV: 84.1 fL (ref 80.0–100.0)
Monocytes Absolute: 0.6 10*3/uL (ref 0.1–1.0)
Monocytes Relative: 9 %
Neutro Abs: 4.7 10*3/uL (ref 1.7–7.7)
Neutrophils Relative %: 70 %
Platelet Count: 323 10*3/uL (ref 150–400)
RBC: 5.61 MIL/uL — ABNORMAL HIGH (ref 3.87–5.11)
RDW: 13.2 % (ref 11.5–15.5)
WBC Count: 6.7 10*3/uL (ref 4.0–10.5)
nRBC: 0 % (ref 0.0–0.2)

## 2023-05-25 LAB — CMP (CANCER CENTER ONLY)
ALT: 77 U/L — ABNORMAL HIGH (ref 0–44)
AST: 59 U/L — ABNORMAL HIGH (ref 15–41)
Albumin: 4 g/dL (ref 3.5–5.0)
Alkaline Phosphatase: 132 U/L — ABNORMAL HIGH (ref 38–126)
Anion gap: 12 (ref 5–15)
BUN: 19 mg/dL (ref 8–23)
CO2: 26 mmol/L (ref 22–32)
Calcium: 9.5 mg/dL (ref 8.9–10.3)
Chloride: 97 mmol/L — ABNORMAL LOW (ref 98–111)
Creatinine: 0.73 mg/dL (ref 0.44–1.00)
GFR, Estimated: >60 mL/min (ref >60)
Glucose, Bld: 95 mg/dL (ref 70–99)
Potassium: 4.2 mmol/L (ref 3.5–5.1)
Sodium: 135 mmol/L (ref 135–145)
Total Bilirubin: 0.8 mg/dL (ref 0.0–1.2)
Total Protein: 7.6 g/dL (ref 6.5–8.1)
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
## 2023-05-26 LAB — ERYTHROPOIETIN: Erythropoietin: 10.9 m[IU]/mL (ref 2.6–18.5)

## 2023-06-02 LAB — CALR +MPL + E12-E15  (REFLEX)

## 2023-06-02 LAB — JAK2 V617F RFX CALR/MPL/E12-15

## 2023-06-21 DIAGNOSIS — E663 Overweight: Secondary | ICD-10-CM | POA: Diagnosis not present

## 2023-06-21 DIAGNOSIS — Z6825 Body mass index (BMI) 25.0-25.9, adult: Secondary | ICD-10-CM | POA: Diagnosis not present

## 2023-06-21 DIAGNOSIS — E785 Hyperlipidemia, unspecified: Secondary | ICD-10-CM | POA: Diagnosis not present

## 2023-06-21 DIAGNOSIS — I10 Essential (primary) hypertension: Secondary | ICD-10-CM | POA: Diagnosis not present

## 2023-06-21 DIAGNOSIS — R7303 Prediabetes: Secondary | ICD-10-CM | POA: Diagnosis not present

## 2023-07-07 ENCOUNTER — Other Ambulatory Visit: Payer: Self-pay | Admitting: Family Medicine

## 2023-07-07 NOTE — Telephone Encounter (Signed)
 Last office visit 01/12/23 for suture removal.  Last refilled 05/13/2023 for #60 with 1 refill.  Next Appt: No future appointments with PCP.    Please call and schedule Medicare Wellness with Cornelius Dill and CPE with fasting labs prior with Dr. Cherlyn Cornet after 07/23/2023.

## 2023-07-07 NOTE — Telephone Encounter (Signed)
lvm for pt to call office to reschedule appt

## 2023-07-08 NOTE — Telephone Encounter (Signed)
 lvm for pt to call office to schedule appt.

## 2023-07-09 NOTE — Telephone Encounter (Signed)
 lvm for pt to call office to schedule appt.

## 2023-07-20 DIAGNOSIS — E663 Overweight: Secondary | ICD-10-CM | POA: Diagnosis not present

## 2023-07-20 DIAGNOSIS — I10 Essential (primary) hypertension: Secondary | ICD-10-CM | POA: Diagnosis not present

## 2023-07-20 DIAGNOSIS — Z6824 Body mass index (BMI) 24.0-24.9, adult: Secondary | ICD-10-CM | POA: Diagnosis not present

## 2023-07-20 DIAGNOSIS — E785 Hyperlipidemia, unspecified: Secondary | ICD-10-CM | POA: Diagnosis not present

## 2023-07-20 DIAGNOSIS — R7303 Prediabetes: Secondary | ICD-10-CM | POA: Diagnosis not present

## 2023-07-26 ENCOUNTER — Other Ambulatory Visit: Payer: Self-pay | Admitting: Family Medicine

## 2023-08-18 DIAGNOSIS — E663 Overweight: Secondary | ICD-10-CM | POA: Diagnosis not present

## 2023-08-18 DIAGNOSIS — E785 Hyperlipidemia, unspecified: Secondary | ICD-10-CM | POA: Diagnosis not present

## 2023-08-18 DIAGNOSIS — Z6823 Body mass index (BMI) 23.0-23.9, adult: Secondary | ICD-10-CM | POA: Diagnosis not present

## 2023-08-18 DIAGNOSIS — I10 Essential (primary) hypertension: Secondary | ICD-10-CM | POA: Diagnosis not present

## 2023-08-18 DIAGNOSIS — R7303 Prediabetes: Secondary | ICD-10-CM | POA: Diagnosis not present

## 2023-08-23 ENCOUNTER — Encounter: Payer: Self-pay | Admitting: Family Medicine

## 2023-08-23 ENCOUNTER — Other Ambulatory Visit: Payer: Self-pay | Admitting: Family Medicine

## 2023-08-23 DIAGNOSIS — E7800 Pure hypercholesterolemia, unspecified: Secondary | ICD-10-CM

## 2023-08-23 DIAGNOSIS — R7303 Prediabetes: Secondary | ICD-10-CM

## 2023-08-29 ENCOUNTER — Other Ambulatory Visit: Payer: Self-pay | Admitting: Family Medicine

## 2023-09-03 ENCOUNTER — Other Ambulatory Visit

## 2023-09-03 DIAGNOSIS — R7303 Prediabetes: Secondary | ICD-10-CM | POA: Diagnosis not present

## 2023-09-03 DIAGNOSIS — E7800 Pure hypercholesterolemia, unspecified: Secondary | ICD-10-CM

## 2023-09-03 LAB — LIPID PANEL
Cholesterol: 218 mg/dL — ABNORMAL HIGH (ref 0–200)
HDL: 62.5 mg/dL (ref >39.00)
LDL Cholesterol: 128 mg/dL — ABNORMAL HIGH (ref 0–99)
NonHDL: 155.79
Total CHOL/HDL Ratio: 3
Triglycerides: 141 mg/dL (ref 0.0–149.0)
VLDL: 28.2 mg/dL (ref 0.0–40.0)

## 2023-09-03 LAB — COMPREHENSIVE METABOLIC PANEL WITH GFR
ALT: 29 U/L (ref 0–35)
AST: 24 U/L (ref 0–37)
Albumin: 4.2 g/dL (ref 3.5–5.2)
Alkaline Phosphatase: 114 U/L (ref 39–117)
BUN: 16 mg/dL (ref 6–23)
CO2: 35 meq/L — ABNORMAL HIGH (ref 19–32)
Calcium: 9.8 mg/dL (ref 8.4–10.5)
Chloride: 98 meq/L (ref 96–112)
Creatinine, Ser: 0.8 mg/dL (ref 0.40–1.20)
GFR: 76.08 mL/min (ref >60.00)
Glucose, Bld: 81 mg/dL (ref 70–99)
Potassium: 4.6 meq/L (ref 3.5–5.1)
Sodium: 139 meq/L (ref 135–145)
Total Bilirubin: 0.7 mg/dL (ref 0.2–1.2)
Total Protein: 6.8 g/dL (ref 6.0–8.3)

## 2023-09-03 LAB — HEMOGLOBIN A1C: Hgb A1c MFr Bld: 5.7 % (ref 4.6–6.5)

## 2023-09-07 ENCOUNTER — Ambulatory Visit: Payer: Self-pay | Admitting: Family Medicine

## 2023-09-07 NOTE — Progress Notes (Signed)
 No critical labs need to be addressed urgently. We will discuss labs in detail at upcoming office visit.

## 2023-09-08 ENCOUNTER — Ambulatory Visit
Admission: RE | Admit: 2023-09-08 | Discharge: 2023-09-08 | Disposition: A | Discharge status: Home / Self Care | Source: Ambulatory Visit | Attending: Internal Medicine | Admitting: Internal Medicine

## 2023-09-08 DIAGNOSIS — C50919 Malignant neoplasm of unspecified site of unspecified female breast: Secondary | ICD-10-CM

## 2023-09-08 DIAGNOSIS — Z923 Personal history of irradiation: Secondary | ICD-10-CM | POA: Insufficient documentation

## 2023-09-08 DIAGNOSIS — Z853 Personal history of malignant neoplasm of breast: Secondary | ICD-10-CM | POA: Diagnosis not present

## 2023-09-08 DIAGNOSIS — R92323 Mammographic fibroglandular density, bilateral breasts: Secondary | ICD-10-CM | POA: Diagnosis not present

## 2023-09-08 DIAGNOSIS — Z08 Encounter for follow-up examination after completed treatment for malignant neoplasm: Secondary | ICD-10-CM | POA: Insufficient documentation

## 2023-09-08 DIAGNOSIS — R928 Other abnormal and inconclusive findings on diagnostic imaging of breast: Secondary | ICD-10-CM | POA: Diagnosis not present

## 2023-09-10 ENCOUNTER — Ambulatory Visit: Admitting: Family Medicine

## 2023-09-10 ENCOUNTER — Encounter: Payer: Self-pay | Admitting: Family Medicine

## 2023-09-10 ENCOUNTER — Ambulatory Visit

## 2023-09-10 VITALS — BP 110/68 | Ht 61.0 in | Wt 131.0 lb

## 2023-09-10 VITALS — BP 112/80 | HR 76 | Temp 98.0°F | Ht 60.5 in | Wt 131.0 lb

## 2023-09-10 DIAGNOSIS — M797 Fibromyalgia: Secondary | ICD-10-CM | POA: Diagnosis not present

## 2023-09-10 DIAGNOSIS — E7800 Pure hypercholesterolemia, unspecified: Secondary | ICD-10-CM | POA: Diagnosis not present

## 2023-09-10 DIAGNOSIS — R7303 Prediabetes: Secondary | ICD-10-CM | POA: Diagnosis not present

## 2023-09-10 DIAGNOSIS — D751 Secondary polycythemia: Secondary | ICD-10-CM | POA: Diagnosis not present

## 2023-09-10 DIAGNOSIS — F3341 Major depressive disorder, recurrent, in partial remission: Secondary | ICD-10-CM

## 2023-09-10 DIAGNOSIS — C50912 Malignant neoplasm of unspecified site of left female breast: Secondary | ICD-10-CM

## 2023-09-10 DIAGNOSIS — Z0000 Encounter for general adult medical examination without abnormal findings: Secondary | ICD-10-CM

## 2023-09-10 DIAGNOSIS — R7989 Other specified abnormal findings of blood chemistry: Secondary | ICD-10-CM

## 2023-09-10 DIAGNOSIS — M8580 Other specified disorders of bone density and structure, unspecified site: Secondary | ICD-10-CM

## 2023-09-10 DIAGNOSIS — I10 Essential (primary) hypertension: Secondary | ICD-10-CM

## 2023-09-10 MED ORDER — TRAMADOL HCL 50 MG PO TABS: 50.0000 mg | ORAL_TABLET | Freq: Two times a day (BID) | ORAL | 0 refills | Status: AC | PRN
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
## 2023-09-10 NOTE — Assessment & Plan Note (Signed)
Detected on screening mammogram.  Status post left breast biopsy on 09/25/2021.   Followed by oncology Dr. Michae Kava  - s/p left breast lumpectomy and SLNB on 10/28/2021. 8 mm IDC, 0/4 lymph nodes involved, margins negative, overall grade 2. Rare foci DCIS, margin from DCIS 12 mm    - Oncotype Dx RS 4. No chemotherapy.    - Radiation to Rt breast with scar boost 12/03/21 to 12/31/21   - Started Letrozole 2.5 mg daily on 01/01/2022.

## 2023-09-10 NOTE — Patient Instructions (Addendum)
 Katie Dennis , Thank you for taking time out of your busy schedule to complete your Annual Wellness Visit with me. I enjoyed our conversation and look forward to speaking with you again next year. I, as well as your care team,  appreciate your ongoing commitment to your health goals. Please review the following plan we discussed and let me know if I can assist you in the future. Your Game plan/ To Do List    Referrals: If you haven't heard from the office you've been referred to, please reach out to them at the phone provided.   Follow up Visits: We will see or speak with you next year for your Next Medicare AWV with our clinical staff Have you seen your provider in the last 6 months (3 months if uncontrolled diabetes)?YES  Clinician Recommendations:  Aim for 30 minutes of exercise or brisk walking, 6-8 glasses of water , and 5 servings of fruits and vegetables each day.       This is a list of the screenings recommended for you:  Health Maintenance  Topic Date Due   COVID-19 Vaccine (8 - Pfizer risk 2024-25 season) 05/17/2023   Flu Shot  05/09/2024*   Cologuard (Stool DNA test)  08/04/2024   Medicare Annual Wellness Visit  09/09/2024   Mammogram  09/07/2025   DEXA scan (bone density measurement)  01/21/2027   DTaP/Tdap/Td vaccine (4 - Td or Tdap) 01/02/2033   Pneumococcal Vaccine for age over 29  Completed   Hepatitis C Screening  Completed   Zoster (Shingles) Vaccine  Completed   Hepatitis B Vaccine  Aged Out   HPV Vaccine  Aged Out   Meningitis B Vaccine  Aged Out   Colon Cancer Screening  Discontinued  *Topic was postponed. The date shown is not the original due date.    Advanced directives: (Declined) Advance directive discussed with you today. Even though you declined this today, please call our office should you change your mind, and we can give you the proper paperwork for you to fill out. Advance Care Planning is important because it:  [x]  Makes sure you receive the medical  care that is consistent with your values, goals, and preferences  [x]  It provides guidance to your family and loved ones and reduces their decisional burden about whether or not they are making the right decisions based on your wishes.  Follow the link provided in your after visit summary or read over the paperwork we have mailed to you to help you started getting your Advance Directives in place. If you need assistance in completing these, please reach out to us  so that we can help you!

## 2023-09-10 NOTE — Assessment & Plan Note (Signed)
 Chronic, well controlled. Tramadol  prn.

## 2023-09-10 NOTE — Assessment & Plan Note (Signed)
Good control on HCTZ 25 mg daily and losartan 100 mg daily  

## 2023-09-10 NOTE — Progress Notes (Signed)
 Patient ID: ROSARIO DUEY, female    DOB: 1955-11-29, 68 y.o.   MRN: [id number]  This visit was conducted in person.  BP 112/80   Pulse 76   Temp 98 F (36.7 C) (Temporal)   Ht 5' 0.5 (1.537 m)   Wt 131 lb (59.4 kg)   SpO2 98%   BMI 25.16 kg/m    CC:  Chief Complaint  Patient presents with   Annual Exam    Part 2 (MWV today at 3:40 pm    Subjective:   HPI: CHAYCE RULLO is a 68 y.o. female presenting on 09/10/2023 for  Welcome to Annual Exam (Part 2 (MWV today at 3:40 pm)  The patient presents for  complete physical and review of chronic health problems. He/She also has the following acute concerns today:    The patient will see a LPN or RN for medicare wellness visit. Today after my visit  Prevention and wellness was reviewed in detail.  Breast cancer on active medical treatment with letrozole  since 2023  Elevated Cholesterol:  Significant improvement of cholesterol  with addition of atorvastatin  10 mg daily        Lab Results  Component Value Date   CHOL 218 (H) 09/03/2023   HDL 62.50 09/03/2023   LDLCALC 128 (H) 09/03/2023   LDLDIRECT 180 (H) 11/24/2022   TRIG 141.0 09/03/2023   CHOLHDL 3 09/03/2023  The 10-year ASCVD risk score (Arnett DK, et al., 2019) is: 7.1%   Values used to calculate the score:     Age: 41 years     Clincally relevant sex: Female     Is Non-Hispanic African American: No     Diabetic: No     Tobacco smoker: No     Systolic Blood Pressure: 112 mmHg     Is BP treated: Yes     HDL Cholesterol: 62.5 mg/dL     Total Cholesterol: 218 mg/dL Using medications without problems: none Muscle aches: none Diet compliance: moderate Exercise: will re-start Other complaints:  Hypertension:   Well controlled on losartan  100 mg daily, HCTZ 25 mg daily  BP Readings from Last 3 Encounters:  09/10/23 112/80  05/25/23 (!) 115/59  03/18/23 123/72  Using medication without problems or lightheadedness:  none Chest pain with exertion: none Edema:  occ  at end of the day Short of breath: yes Average home BPs: Other issues:    Prediabetes   improving Lab Results  Component Value Date   HGBA1C 5.7 09/03/2023      MDD :  Stable control on cymbalta   60 mg daily Flowsheet Row Office Visit from [facility]/10/20[facility] in [facility]  PHQ-2 Total Score 0    Fibromyalgia: using tramadol  prn pain flares.  Relevant past medical, surgical, family and social history reviewed and updated as indicated. Interim medical history since our last visit reviewed. Allergies and medications reviewed and updated. Outpatient Medications Prior to Visit  Medication Sig Dispense Refill   atorvastatin  (LIPITOR) 10 MG tablet Take 1 tablet (10 mg total) by mouth daily. 90 tablet 3   calcium  carbonate (OS-CAL - DOSED IN MG OF ELEMENTAL CALCIUM ) 1250 (500 Ca) MG tablet Take 1 tablet by mouth 2 (two) times daily with a meal.     cyclobenzaprine  (FLEXERIL ) 10 MG tablet TAKE 1 TO 2 TABLETS BY MOUTH DAILY AT BEDTIME 60 tablet 1   DULoxetine  (CYMBALTA ) 60 MG capsule TAKE 1 CAPSULE (60 MG TOTAL) BY MOUTH AT BEDTIME.  90 capsule 1   ferrous sulfate 325 (65 FE) MG EC tablet Take 325 mg by mouth in the morning and at bedtime.     hydrochlorothiazide  (HYDRODIURIL ) 25 MG tablet TAKE 1 TABLET BY MOUTH EVERY DAY 90 tablet 3   letrozole  (FEMARA ) 2.5 MG tablet Take 1 tablet (2.5 mg total) by mouth daily. 90 tablet 3   losartan  (COZAAR ) 100 MG tablet TAKE 1 TABLET BY MOUTH EVERY DAY 90 tablet 0   Multiple Vitamins-Minerals (MULTIVITAMIN WOMEN PO) Take by mouth daily.     OZEMPIC, 0.25 OR 0.5 MG/DOSE, 2 MG/3ML SOPN 0.25 mg Subcutaneous weekly for 30 days     traMADol  (ULTRAM ) 50 MG tablet Take 1 tablet (50 mg total) by mouth 2 (two) times daily as needed. 60 tablet 0   bacitracin  ointment Apply 1 Application topically 2 (two) times daily. 14 g 0   No facility-administered medications prior to visit.     Per HPI unless specifically indicated in ROS section  below Review of Systems  Constitutional:  Negative for fatigue and fever.  HENT:  Negative for congestion.   Eyes:  Negative for pain.  Respiratory:  Negative for cough and shortness of breath.   Cardiovascular:  Negative for chest pain, palpitations and leg swelling.  Gastrointestinal:  Negative for abdominal pain.  Genitourinary:  Negative for dysuria and vaginal bleeding.  Musculoskeletal:  Negative for back pain.  Neurological:  Negative for syncope, light-headedness and headaches.  Psychiatric/Behavioral:  Negative for dysphoric mood.    Objective:  BP 112/80   Pulse 76   Temp 98 F (36.7 C) (Temporal)   Ht 5' 0.5 (1.537 m)   Wt 131 lb (59.4 kg)   SpO2 98%   BMI 25.16 kg/m   Wt Readings from Last 3 Encounters:  09/10/23 131 lb (59.4 kg)  05/25/23 136 lb (61.7 kg)  03/18/23 146 lb (66.2 kg)      Physical Exam    Results for orders placed or performed in visit on 09/03/23  Comprehensive metabolic panel with GFR   Collection Time: 09/03/23 10:58 AM  Result Value Ref Range   Sodium 139 135 - 145 mEq/L   Potassium 4.6 3.5 - 5.1 mEq/L   Chloride 98 96 - 112 mEq/L   CO2 35 (H) 19 - 32 mEq/L   Glucose, Bld 81 70 - 99 mg/dL   BUN 16 6 - 23 mg/dL   Creatinine, Ser 9.19 0.40 - 1.20 mg/dL   Total Bilirubin 0.7 0.2 - 1.2 mg/dL   Alkaline Phosphatase 114 39 - 117 U/L   AST 24 0 - 37 U/L   ALT 29 0 - 35 U/L   Total Protein 6.8 6.0 - 8.3 g/dL   Albumin 4.2 3.5 - 5.2 g/dL   GFR 23.91 >39.99 mL/min   Calcium  9.8 8.4 - 10.5 mg/dL  Hemoglobin J8r   Collection Time: 09/03/23 10:58 AM  Result Value Ref Range   Hgb A1c MFr Bld 5.7 4.6 - 6.5 %  Lipid panel   Collection Time: 09/03/23 10:58 AM  Result Value Ref Range   Cholesterol 218 (H) 0 - 200 mg/dL   Triglycerides 858.9 0.0 - 149.0 mg/dL   HDL 37.49 >60.99 mg/dL   VLDL 71.7 0.0 - 59.9 mg/dL   LDL Cholesterol 871 (H) 0 - 99 mg/dL   Total CHOL/HDL Ratio 3    NonHDL 155.79      COVID 19 screen:  No recent travel or  known exposure to COVID19 The patient  denies respiratory symptoms of COVID 19 at this time. The importance of social distancing was discussed today.   Assessment and Plan The patient's preventative maintenance and recommended screening tests for an annual wellness exam were reviewed in full today. Brought up to date unless services declined.  Counselled on the importance of diet, exercise, and its role in overall health and mortality. The patient's FH and SH was reviewed, including their home life, tobacco status, and drug and alcohol status.    Last pap 2023 no further indicated. No family history of ovarian  uterine cancer. Colon cancer screening:   ifob neg 05/2019,   cologuard 2023 negative. Repeat in 2026  mammogram  left breast cancer, last on 09/08/2023 Vaccines: uptodate with Tdap, flu, COVID x 4, PNA 20, shingrix DEXA: 2023 osteopenia repeat  in 2 years, on Femara  DUE 01/2024.. ordered. Former smoker 25-35 pack year history, QUIT remotely 1996  Hep C: done     Routine general medical examination at a health care facility  Recurrent major depressive disorder, in partial remission (HCC) Assessment & Plan: Stable, chronic.  Continue current medication.    Cymbalta  60 mg daily   Primary hypertension Assessment & Plan: Good control on HCTZ 25 mg daily and losartan  100 mg daily    Pure hypercholesterolemia Assessment & Plan: Stable, chronic.  Continue current medication.  Atorvastatin  10 mg daily   Invasive ductal carcinoma of left breast [facility]) Assessment & Plan: Detected on screening mammogram.  Status post left breast biopsy on 09/25/2021.   Followed by oncology Dr. Brutus  - s/p left breast lumpectomy and SLNB on 10/28/2021. 8 mm IDC, 0/4 lymph nodes involved, margins negative, overall grade 2. Rare foci DCIS, margin from DCIS 12 mm    - Oncotype Dx RS 4. No chemotherapy.    - Radiation to Rt breast with scar boost 12/03/21 to 12/31/21   - Started Letrozole   2.5 mg daily on 01/01/2022.    Prediabetes Assessment & Plan: Diet controlled. Encouraged exercise, weight loss, healthy eating habits.    Elevated LFTs Assessment & Plan:  Resolved History of fatty liver.     Polycythemia Assessment & Plan: Followed by hematology   Fibromyalgia Assessment & Plan:  Chronic, well controlled. Tramadol  prn.   Osteopenia, unspecified location -     DG Bone Density; Future  Other orders -     traMADol  HCl; Take 1 tablet (50 mg total) by mouth 2 (two) times daily as needed.  Dispense: 60 tablet; Refill: 0     Greig Ring, MD

## 2023-09-10 NOTE — Assessment & Plan Note (Signed)
 Resolved History of fatty liver.

## 2023-09-10 NOTE — Assessment & Plan Note (Signed)
 Followed by hematology

## 2023-09-10 NOTE — Assessment & Plan Note (Signed)
Diet controlled. Encouraged exercise, weight loss, healthy eating habits.  

## 2023-09-10 NOTE — Assessment & Plan Note (Signed)
Stable, chronic.  Continue current medication.   Atorvastatin 10 mg daily 

## 2023-09-10 NOTE — Assessment & Plan Note (Signed)
Stable, chronic.  Continue current medication.  Cymbalta 60 mg daily

## 2023-09-13 ENCOUNTER — Ambulatory Visit (INDEPENDENT_AMBULATORY_CARE_PROVIDER_SITE_OTHER): Admitting: Surgery

## 2023-09-13 ENCOUNTER — Encounter: Payer: Self-pay | Admitting: Surgery

## 2023-09-13 VITALS — BP 112/73 | HR 67 | Temp 97.8°F | Ht 60.5 in | Wt 132.2 lb

## 2023-09-13 DIAGNOSIS — Z853 Personal history of malignant neoplasm of breast: Secondary | ICD-10-CM

## 2023-09-13 NOTE — Patient Instructions (Signed)
 Breast Self-Awareness Breast self-awareness is knowing how your breasts look and feel. You need to: Check your breasts on a regular basis. Tell your doctor about any changes. Become familiar with the look and feel of your breasts. This can help you catch a breast problem while it is still small and can be treated. You should do breast self-exams even if you have breast implants. What you need: A mirror. A well-lit room. A pillow or other soft object. How to do a breast self-exam Follow these steps to do a breast self-exam: Look for changes  Take off all the clothes above your waist. Stand in front of a mirror in a room with good lighting. Put your hands down at your sides. Compare your breasts in the mirror. Look for any difference between them, such as: A difference in shape. A difference in size. Wrinkles, dips, and bumps in one breast and not the other. Look at each breast for changes in the skin, such as: Redness. Scaly areas. Skin that has gotten thicker. Dimpling. Open sores (ulcers). Look for changes in your nipples, such as: Fluid coming out of a nipple. Fluid around a nipple. Bleeding. Dimpling. Redness. A nipple that looks pushed in (retracted), or that has changed position. Feel for changes Lie on your back. Feel each breast. To do this: Pick a breast to feel. Place a pillow under the shoulder closest to that breast. Put the arm closest to that breast behind your head. Feel the nipple area of that breast using the hand of your other arm. Feel the area with the pads of your three middle fingers by making small circles with your fingers. Use light, medium, and firm pressure. Continue the overlapping circles, moving downward over the breast. Keep making circles with your fingers. Stop when you feel your ribs. Start making circles with your fingers again, this time going upward until you reach your collarbone. Then, make circles outward across your breast and into your  armpit area. Squeeze your nipple. Check for discharge and lumps. Repeat these steps to check your other breast. Sit or stand in the tub or shower. With soapy water on your skin, feel each breast the same way you did when you were lying down. Write down what you find Writing down what you find can help you remember what to tell your doctor. Write down: What is normal for each breast. Any changes you find in each breast. These include: The kind of changes you find. A tender or painful breast. Any lump you find. Write down its size and where it is. When you last had your monthly period (menstrual cycle). General tips If you are breastfeeding, the best time to check your breasts is after you feed your baby or after you use a breast pump. If you get monthly bleeding, the best time to check your breasts is 5-7 days after your monthly cycle ends. With time, you will become comfortable with the self-exam. You will also start to know if there are changes in your breasts. Contact a doctor if: You see a change in the shape or size of your breasts or nipples. You see a change in the skin of your breast or nipples, such as red or scaly skin. You have fluid coming from your nipples that is not normal. You find a new lump or thick area. You have breast pain. You have any concerns about your breast health. Summary Breast self-awareness includes looking for changes in your breasts and feeling for changes  within your breasts. You should do breast self-awareness in front of a mirror in a well-lit room. If you get monthly periods (menstrual cycles), the best time to check your breasts is 5-7 days after your period ends. Tell your doctor about any changes you see in your breasts. Changes include changes in size, changes on the skin, painful or tender breasts, or fluid from your nipples that is not normal. This information is not intended to replace advice given to you by your health care provider. Make sure  you discuss any questions you have with your health care provider. Document Revised: 07/03/2021 Document Reviewed: 11/28/2020 Elsevier Patient Education  2024 [employer].

## 2023-09-15 ENCOUNTER — Encounter: Payer: Self-pay | Admitting: Surgery

## 2023-09-15 ENCOUNTER — Other Ambulatory Visit: Payer: Self-pay | Admitting: Family Medicine

## 2023-09-15 DIAGNOSIS — R7303 Prediabetes: Secondary | ICD-10-CM | POA: Diagnosis not present

## 2023-09-15 DIAGNOSIS — I10 Essential (primary) hypertension: Secondary | ICD-10-CM | POA: Diagnosis not present

## 2023-09-15 DIAGNOSIS — E663 Overweight: Secondary | ICD-10-CM | POA: Diagnosis not present

## 2023-09-15 DIAGNOSIS — E785 Hyperlipidemia, unspecified: Secondary | ICD-10-CM | POA: Diagnosis not present

## 2023-09-15 DIAGNOSIS — Z6824 Body mass index (BMI) 24.0-24.9, adult: Secondary | ICD-10-CM | POA: Diagnosis not present
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
## 2023-09-15 NOTE — Telephone Encounter (Signed)
 Last office visit 09/10/2023 for CPE.  Last refilled 07/07/2023 for #60 with 1 refill.  Next appt: No future appointments with PCP.

## 2023-10-27 DIAGNOSIS — R7303 Prediabetes: Secondary | ICD-10-CM | POA: Diagnosis not present

## 2023-10-27 DIAGNOSIS — Z6824 Body mass index (BMI) 24.0-24.9, adult: Secondary | ICD-10-CM | POA: Diagnosis not present

## 2023-10-27 DIAGNOSIS — I10 Essential (primary) hypertension: Secondary | ICD-10-CM | POA: Diagnosis not present

## 2023-10-27 DIAGNOSIS — E663 Overweight: Secondary | ICD-10-CM | POA: Diagnosis not present

## 2023-10-27 DIAGNOSIS — E785 Hyperlipidemia, unspecified: Secondary | ICD-10-CM | POA: Diagnosis not present

## 2023-11-13 ENCOUNTER — Other Ambulatory Visit: Payer: Self-pay | Admitting: Family Medicine

## 2023-11-15 NOTE — Telephone Encounter (Signed)
 Last office visit 09/10/23 for CPE.  Last refilled 09/16/23 for #60 with 1 refill.  Next appt: No future appointments with PCP.

## 2023-11-23 ENCOUNTER — Other Ambulatory Visit: Payer: Self-pay

## 2023-11-23 DIAGNOSIS — D751 Secondary polycythemia: Secondary | ICD-10-CM

## 2023-11-23 DIAGNOSIS — C50919 Malignant neoplasm of unspecified site of unspecified female breast: Secondary | ICD-10-CM

## 2023-11-24 ENCOUNTER — Encounter: Payer: Self-pay | Admitting: Oncology

## 2023-11-24 ENCOUNTER — Inpatient Hospital Stay: Admitting: Oncology

## 2023-11-24 ENCOUNTER — Inpatient Hospital Stay: Attending: Oncology

## 2023-11-24 VITALS — BP 125/82 | HR 76 | Temp 97.3°F | Resp 19 | Ht 60.5 in | Wt 136.8 lb

## 2023-11-24 DIAGNOSIS — Z853 Personal history of malignant neoplasm of breast: Secondary | ICD-10-CM | POA: Diagnosis not present

## 2023-11-24 DIAGNOSIS — C50912 Malignant neoplasm of unspecified site of left female breast: Secondary | ICD-10-CM | POA: Insufficient documentation

## 2023-11-24 DIAGNOSIS — Z79899 Other long term (current) drug therapy: Secondary | ICD-10-CM | POA: Diagnosis not present

## 2023-11-24 DIAGNOSIS — M8580 Other specified disorders of bone density and structure, unspecified site: Secondary | ICD-10-CM | POA: Insufficient documentation

## 2023-11-24 DIAGNOSIS — Z79811 Long term (current) use of aromatase inhibitors: Secondary | ICD-10-CM | POA: Insufficient documentation

## 2023-11-24 DIAGNOSIS — Z87891 Personal history of nicotine dependence: Secondary | ICD-10-CM | POA: Insufficient documentation

## 2023-11-24 DIAGNOSIS — Z170 Estrogen receptor positive status [ER+]: Secondary | ICD-10-CM | POA: Diagnosis not present

## 2023-11-24 DIAGNOSIS — D751 Secondary polycythemia: Secondary | ICD-10-CM | POA: Insufficient documentation

## 2023-11-24 DIAGNOSIS — Z08 Encounter for follow-up examination after completed treatment for malignant neoplasm: Secondary | ICD-10-CM | POA: Diagnosis not present

## 2023-11-24 DIAGNOSIS — C50919 Malignant neoplasm of unspecified site of unspecified female breast: Secondary | ICD-10-CM

## 2023-11-24 LAB — CBC WITH DIFFERENTIAL (CANCER CENTER ONLY)
Abs Immature Granulocytes: 0.04 K/uL (ref 0.00–0.07)
Basophils Absolute: 0.1 K/uL (ref 0.0–0.1)
Basophils Relative: 1 %
Eosinophils Absolute: 0.4 K/uL (ref 0.0–0.5)
Eosinophils Relative: 7 %
HCT: 47.2 % — ABNORMAL HIGH (ref 36.0–46.0)
Hemoglobin: 16.6 g/dL — ABNORMAL HIGH (ref 12.0–15.0)
Immature Granulocytes: 1 %
Lymphocytes Relative: 16 %
Lymphs Abs: 1 K/uL (ref 0.7–4.0)
MCH: 29.3 pg (ref 26.0–34.0)
MCHC: 35.2 g/dL (ref 30.0–36.0)
MCV: 83.2 fL (ref 80.0–100.0)
Monocytes Absolute: 0.6 K/uL (ref 0.1–1.0)
Monocytes Relative: 10 %
Neutro Abs: 4 K/uL (ref 1.7–7.7)
Neutrophils Relative %: 65 %
Platelet Count: 253 K/uL (ref 150–400)
RBC: 5.67 MIL/uL — ABNORMAL HIGH (ref 3.87–5.11)
RDW: 12.7 % (ref 11.5–15.5)
WBC Count: 6.1 K/uL (ref 4.0–10.5)
nRBC: 0 % (ref 0.0–0.2)

## 2023-11-24 LAB — CMP (CANCER CENTER ONLY)
ALT: 28 U/L (ref 0–44)
AST: 27 U/L (ref 15–41)
Albumin: 4 g/dL (ref 3.5–5.0)
Alkaline Phosphatase: 105 U/L (ref 38–126)
Anion gap: 8 (ref 5–15)
BUN: 19 mg/dL (ref 8–23)
CO2: 28 mmol/L (ref 22–32)
Calcium: 9.7 mg/dL (ref 8.9–10.3)
Chloride: 97 mmol/L — ABNORMAL LOW (ref 98–111)
Creatinine: 0.82 mg/dL (ref 0.44–1.00)
GFR, Estimated: >60 mL/min (ref >60)
Glucose, Bld: 91 mg/dL (ref 70–99)
Potassium: 3.7 mmol/L (ref 3.5–5.1)
Sodium: 133 mmol/L — ABNORMAL LOW (ref 135–145)
Total Bilirubin: 0.9 mg/dL (ref 0.0–1.2)
Total Protein: 7.3 g/dL (ref 6.5–8.1)
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
## 2023-11-25 LAB — ERYTHROPOIETIN: Erythropoietin: 9.6 m[IU]/mL (ref 2.6–18.5)

## 2023-11-26 ENCOUNTER — Other Ambulatory Visit: Payer: Self-pay | Admitting: Family Medicine

## 2023-12-02 LAB — JAK2 V617F RFX CALR/MPL/E12-15

## 2023-12-02 LAB — CALR +MPL + E12-E15  (REFLEX)

## 2023-12-08 DIAGNOSIS — E663 Overweight: Secondary | ICD-10-CM | POA: Diagnosis not present

## 2023-12-08 DIAGNOSIS — R7303 Prediabetes: Secondary | ICD-10-CM | POA: Diagnosis not present

## 2023-12-08 DIAGNOSIS — E785 Hyperlipidemia, unspecified: Secondary | ICD-10-CM | POA: Diagnosis not present

## 2023-12-08 DIAGNOSIS — I10 Essential (primary) hypertension: Secondary | ICD-10-CM | POA: Diagnosis not present

## 2023-12-08 DIAGNOSIS — Z6825 Body mass index (BMI) 25.0-25.9, adult: Secondary | ICD-10-CM | POA: Diagnosis not present

## 2023-12-10 ENCOUNTER — Encounter: Payer: Self-pay | Admitting: Family Medicine

## 2023-12-10 ENCOUNTER — Ambulatory Visit: Admitting: Family Medicine

## 2023-12-10 VITALS — BP 122/78 | HR 97 | Temp 98.1°F | Ht 60.05 in | Wt 138.5 lb

## 2023-12-10 DIAGNOSIS — H6011 Cellulitis of right external ear: Secondary | ICD-10-CM

## 2023-12-10 MED ORDER — AMOXICILLIN-POT CLAVULANATE 875-125 MG PO TABS: 1.0000 | ORAL_TABLET | Freq: Two times a day (BID) | ORAL | 0 refills | Status: AC

## 2023-12-10 NOTE — Progress Notes (Signed)
 Yu were 80   Patient ID: Katie Dennis, female    DOB: 1955-10-19, 68 y.o.   MRN: [id number]  This visit was conducted in person.  BP 122/78   Pulse 97   Temp 98.1 F (36.7 C) (Oral)   Ht 5' 0.05 (1.525 m)   Wt 138 lb 8 oz (62.8 kg)   SpO2 96%   BMI 27.00 kg/m    CC:  Chief Complaint  Patient presents with   Ear Pain    Pt here for a stuck earring in her R ear. Pt states in been stuck since 12/06/23.    Subjective:   HPI: Katie Dennis is a 68 y.o. female presenting on 12/10/2023 for Ear Pain (Pt here for a stuck earring in her R ear. Pt states in been stuck since 12/06/23.)    She thinks she has a piece of earring stuck in her right ear canal since 10/27  Was taking out cartilage earing.. dropped back in ear.   She has redness tenderness pinna external ear... noted  redness and soreness of pinna off and on.  No discharge at site of earring.   No fever  No jaw pain, no headache  Relevant past medical, surgical, family and social history reviewed and updated as indicated. Interim medical history since our last visit reviewed. Allergies and medications reviewed and updated. Outpatient Medications Prior to Visit  Medication Sig Dispense Refill   atorvastatin  (LIPITOR) 10 MG tablet Take 1 tablet (10 mg total) by mouth daily. 90 tablet 3   calcium  carbonate (OS-CAL - DOSED IN MG OF ELEMENTAL CALCIUM ) 1250 (500 Ca) MG tablet Take 1 tablet by mouth 2 (two) times daily with a meal.     cyclobenzaprine  (FLEXERIL ) 10 MG tablet TAKE 1 TO 2 TABLETS BY MOUTH DAILY AT BEDTIME 60 tablet 1   DULoxetine  (CYMBALTA ) 60 MG capsule TAKE 1 CAPSULE (60 MG TOTAL) BY MOUTH AT BEDTIME. 90 capsule 1   ferrous sulfate 325 (65 FE) MG EC tablet Take 325 mg by mouth in the morning and at bedtime.     hydrochlorothiazide  (HYDRODIURIL ) 25 MG tablet TAKE 1 TABLET BY MOUTH EVERY DAY 90 tablet 3   letrozole  (FEMARA ) 2.5 MG tablet Take 1 tablet (2.5 mg total) by mouth daily. 90 tablet 3   losartan  (COZAAR )  100 MG tablet TAKE 1 TABLET BY MOUTH EVERY DAY 90 tablet 3   Multiple Vitamins-Minerals (MULTIVITAMIN WOMEN PO) Take by mouth daily.     OZEMPIC, 0.25 OR 0.5 MG/DOSE, 2 MG/3ML SOPN 0.25 mg Subcutaneous weekly for 30 days     traMADol  (ULTRAM ) 50 MG tablet Take 1 tablet (50 mg total) by mouth 2 (two) times daily as needed. 60 tablet 0   No facility-administered medications prior to visit.     Per HPI unless specifically indicated in ROS section below Review of Systems  Constitutional:  Negative for fatigue and fever.  HENT:  Negative for congestion.   Eyes:  Negative for pain.  Respiratory:  Negative for cough and shortness of breath.   Cardiovascular:  Negative for chest pain, palpitations and leg swelling.  Gastrointestinal:  Negative for abdominal pain.  Genitourinary:  Negative for dysuria and vaginal bleeding.  Musculoskeletal:  Negative for back pain.  Neurological:  Negative for syncope, light-headedness and headaches.  Psychiatric/Behavioral:  Negative for dysphoric mood.    Objective:  BP 122/78   Pulse 97   Temp 98.1 F (36.7 C) (Oral)   Ht 5' 0.05 (1.525 m)  Wt 138 lb 8 oz (62.8 kg)   SpO2 96%   BMI 27.00 kg/m   Wt Readings from Last 3 Encounters:  12/10/23 138 lb 8 oz (62.8 kg)  11/24/23 136 lb 12.8 oz (62.1 kg)  09/13/23 132 lb 3.2 oz (60 kg)      Physical Exam Constitutional:      General: She is not in acute distress.    Appearance: Normal appearance. She is well-developed. She is not ill-appearing or toxic-appearing.  HENT:     Head: Normocephalic.     Right Ear: Hearing, tympanic membrane and ear canal normal. Swelling and tenderness present. No middle ear effusion. Tympanic membrane is not erythematous, retracted or bulging.     Left Ear: Hearing, tympanic membrane, ear canal and external ear normal.  No middle ear effusion. Tympanic membrane is not erythematous, retracted or bulging.     Ears:      Nose: No mucosal edema or rhinorrhea.     Right  Sinus: No maxillary sinus tenderness or frontal sinus tenderness.     Left Sinus: No maxillary sinus tenderness or frontal sinus tenderness.     Mouth/Throat:     Pharynx: Uvula midline.  Eyes:     General: Lids are normal. Lids are everted, no foreign bodies appreciated.     Conjunctiva/sclera: Conjunctivae normal.     Pupils: Pupils are equal, round, and reactive to light.  Neck:     Thyroid : No thyroid  mass or thyromegaly.     Vascular: No carotid bruit.     Trachea: Trachea normal.  Cardiovascular:     Rate and Rhythm: Normal rate and regular rhythm.     Pulses: Normal pulses.     Heart sounds: Normal heart sounds, S1 normal and S2 normal. No murmur heard.    No friction rub. No gallop.  Pulmonary:     Effort: Pulmonary effort is normal. No tachypnea or respiratory distress.     Breath sounds: Normal breath sounds. No decreased breath sounds, wheezing, rhonchi or rales.  Abdominal:     General: Bowel sounds are normal.     Palpations: Abdomen is soft.     Tenderness: There is no abdominal tenderness.  Musculoskeletal:     Cervical back: Normal range of motion and neck supple.  Skin:    General: Skin is warm and dry.     Findings: No rash.  Neurological:     Mental Status: She is alert.  Psychiatric:        Mood and Affect: Mood is not anxious or depressed.        Speech: Speech normal.        Behavior: Behavior normal. Behavior is cooperative.        Thought Content: Thought content normal.        Judgment: Judgment normal.       Results for orders placed or performed in visit on 11/24/23  JAK2 V617 reflex CALR/MPL/E12-15   Collection Time: 11/24/23 12:52 PM  Result Value Ref Range   Specimen Type Comment:    JAK2 V617F Result Comment    Reflex Comment    V617F Rfx CALR/MPL/E12-15 Bkgd Comment    Method based next generation sequencing.    References Comment    Director Review Comment   Erythropoietin    Collection Time: 11/24/23 12:52 PM  Result Value Ref  Range   Erythropoietin  9.6 2.6 - 18.5 mIU/mL  CBC with Differential ([facility] Only)   Collection Time: 11/24/23 12:52 PM  Result Value Ref Range   WBC Count 6.1 4.0 - 10.5 K/uL   RBC 5.67 (H) 3.87 - 5.11 MIL/uL   Hemoglobin 16.6 (H) 12.0 - 15.0 g/dL   HCT 52.7 (H) 63.9 - 53.9 %   MCV 83.2 80.0 - 100.0 fL   MCH 29.3 26.0 - 34.0 pg   MCHC 35.2 30.0 - 36.0 g/dL   RDW 87.2 88.4 - 84.4 %   Platelet Count 253 150 - 400 K/uL   nRBC 0.0 0.0 - 0.2 %   Neutrophils Relative % 65 %   Neutro Abs 4.0 1.7 - 7.7 K/uL   Lymphocytes Relative 16 %   Lymphs Abs 1.0 0.7 - 4.0 K/uL   Monocytes Relative 10 %   Monocytes Absolute 0.6 0.1 - 1.0 K/uL   Eosinophils Relative 7 %   Eosinophils Absolute 0.4 0.0 - 0.5 K/uL   Basophils Relative 1 %   Basophils Absolute 0.1 0.0 - 0.1 K/uL   Immature Granulocytes 1 %   Abs Immature Granulocytes 0.04 0.00 - 0.07 K/uL  CALR +MPL + E12-E15 (reflexed)   Collection Time: 11/24/23 12:52 PM  Result Value Ref Range   CALR Result Comment    MPL Result Comment    E12-15 Result Comment   CMP (Cancer Center only)   Collection Time: 11/24/23 12:53 PM  Result Value Ref Range   Sodium 133 (L) 135 - 145 mmol/L   Potassium 3.7 3.5 - 5.1 mmol/L   Chloride 97 (L) 98 - 111 mmol/L   CO2 28 22 - 32 mmol/L   Glucose, Bld 91 70 - 99 mg/dL   BUN 19 8 - 23 mg/dL   Creatinine 9.17 9.55 - 1.00 mg/dL   Calcium  9.7 8.9 - 10.3 mg/dL   Total Protein 7.3 6.5 - 8.1 g/dL   Albumin 4.0 3.5 - 5.0 g/dL   AST 27 15 - 41 U/L   ALT 28 0 - 44 U/L   Alkaline Phosphatase 105 38 - 126 U/L   Total Bilirubin 0.9 0.0 - 1.2 mg/dL   GFR, Estimated >39 >39 mL/min   Anion gap 8 5 - 15    Assessment and Plan  Pinna cellulitis, right Assessment & Plan: Acute, no foreign body seen in ear canal but patient noted to have significant pinna cellulitis at site where earring was removed.  Will treat with Augmentin twice daily x 10 days. Recommend close follow-up if infection not improving within  24 to 48 hours given location and difficult to treat area.  Return and ER precautions provided.   Other orders -     Amoxicillin-Pot Clavulanate; Take 1 tablet by mouth 2 (two) times daily.  Dispense: 20 tablet; Refill: 0    Return in about 3 days (around 12/13/2023).   Greig Ring, MD

## 2023-12-21 DIAGNOSIS — H6011 Cellulitis of right external ear: Secondary | ICD-10-CM | POA: Insufficient documentation

## 2023-12-21 NOTE — Assessment & Plan Note (Signed)
 Acute, no foreign body seen in ear canal but patient noted to have significant pinna cellulitis at site where earring was removed.  Will treat with Augmentin twice daily x 10 days. Recommend close follow-up if infection not improving within 24 to 48 hours given location and difficult to treat area.  Return and ER precautions provided.

## 2023-12-31 ENCOUNTER — Other Ambulatory Visit: Payer: Self-pay | Admitting: Family Medicine

## 2024-01-13 ENCOUNTER — Other Ambulatory Visit: Payer: Self-pay | Admitting: Family Medicine

## 2024-01-13 ENCOUNTER — Encounter: Payer: Self-pay | Admitting: Family Medicine

## 2024-01-21 ENCOUNTER — Other Ambulatory Visit: Payer: Self-pay | Admitting: Family Medicine

## 2024-01-25 ENCOUNTER — Ambulatory Visit: Payer: Self-pay | Admitting: Oncology

## 2024-01-25 ENCOUNTER — Inpatient Hospital Stay: Admission: RE | Admit: 2024-01-25 | Discharge: 2024-01-25 | Attending: Oncology

## 2024-01-25 DIAGNOSIS — Z853 Personal history of malignant neoplasm of breast: Secondary | ICD-10-CM | POA: Diagnosis present

## 2024-01-25 DIAGNOSIS — Z08 Encounter for follow-up examination after completed treatment for malignant neoplasm: Secondary | ICD-10-CM | POA: Insufficient documentation

## 2024-01-25 DIAGNOSIS — M810 Age-related osteoporosis without current pathological fracture: Secondary | ICD-10-CM | POA: Insufficient documentation

## 2024-01-28 ENCOUNTER — Encounter: Payer: Self-pay | Admitting: Family Medicine

## 2024-02-15 ENCOUNTER — Encounter: Payer: Self-pay | Admitting: Family Medicine

## 2024-02-17 ENCOUNTER — Encounter: Payer: Self-pay | Admitting: Oncology

## 2024-02-17 ENCOUNTER — Inpatient Hospital Stay: Admitting: Oncology

## 2024-02-17 ENCOUNTER — Other Ambulatory Visit: Payer: Self-pay

## 2024-02-17 NOTE — Progress Notes (Unsigned)
 Attempted to check in patient for virtual visit, voice message left.

## 2024-02-17 NOTE — Telephone Encounter (Signed)
 Patient later requested to cancel visit for today.

## 2024-02-17 NOTE — Progress Notes (Unsigned)
 Another voice message left to try to check in patient for virtual visit which is not at 1:30pm.

## 2024-03-22 ENCOUNTER — Ambulatory Visit: Payer: Medicare PPO | Admitting: Radiation Oncology

## 2024-05-29 ENCOUNTER — Inpatient Hospital Stay: Admitting: Oncology

## 2024-09-13 ENCOUNTER — Ambulatory Visit
